# Patient Record
Sex: Female | Born: 1980
Health system: Southern US, Community
[De-identification: ages and names within clinical notes are randomized; demographics above are authoritative.]

## PROBLEM LIST (undated history)

## (undated) DIAGNOSIS — K921 Melena: Secondary | ICD-10-CM

## (undated) DIAGNOSIS — F419 Anxiety disorder, unspecified: Secondary | ICD-10-CM

## (undated) DIAGNOSIS — B019 Varicella without complication: Secondary | ICD-10-CM

## (undated) DIAGNOSIS — I493 Ventricular premature depolarization: Secondary | ICD-10-CM

## (undated) DIAGNOSIS — N390 Urinary tract infection, site not specified: Secondary | ICD-10-CM

## (undated) DIAGNOSIS — G43909 Migraine, unspecified, not intractable, without status migrainosus: Secondary | ICD-10-CM

## (undated) DIAGNOSIS — E162 Hypoglycemia, unspecified: Secondary | ICD-10-CM

## (undated) DIAGNOSIS — R011 Cardiac murmur, unspecified: Secondary | ICD-10-CM

## (undated) DIAGNOSIS — O24419 Gestational diabetes mellitus in pregnancy, unspecified control: Secondary | ICD-10-CM

## (undated) DIAGNOSIS — K602 Anal fissure, unspecified: Secondary | ICD-10-CM

## (undated) HISTORY — DX: Hypoglycemia, unspecified: E16.2

## (undated) HISTORY — DX: Ventricular premature depolarization: I49.3

## (undated) HISTORY — DX: Migraine, unspecified, not intractable, without status migrainosus: G43.909

## (undated) HISTORY — DX: Gestational diabetes mellitus in pregnancy, unspecified control: O24.419

## (undated) HISTORY — DX: Cardiac murmur, unspecified: R01.1

## (undated) HISTORY — DX: Anal fissure, unspecified: K60.2

## (undated) HISTORY — DX: Varicella without complication: B01.9

## (undated) HISTORY — DX: Urinary tract infection, site not specified: N39.0

## (undated) HISTORY — PX: WISDOM TOOTH EXTRACTION: SHX21

## (undated) HISTORY — DX: Melena: K92.1

---

## 1999-01-09 ENCOUNTER — Other Ambulatory Visit: Admission: RE | Admit: 1999-01-09 | Discharge: 1999-01-09 | Payer: Self-pay | Admitting: *Deleted

## 2000-02-28 ENCOUNTER — Other Ambulatory Visit: Admission: RE | Admit: 2000-02-28 | Discharge: 2000-02-28 | Payer: Self-pay | Admitting: *Deleted

## 2001-03-31 ENCOUNTER — Other Ambulatory Visit: Admission: RE | Admit: 2001-03-31 | Discharge: 2001-03-31 | Payer: Self-pay | Admitting: Obstetrics and Gynecology

## 2002-03-28 ENCOUNTER — Other Ambulatory Visit: Admission: RE | Admit: 2002-03-28 | Discharge: 2002-03-28 | Payer: Self-pay | Admitting: Obstetrics and Gynecology

## 2003-04-12 ENCOUNTER — Other Ambulatory Visit: Admission: RE | Admit: 2003-04-12 | Discharge: 2003-04-12 | Payer: Self-pay | Admitting: Obstetrics and Gynecology

## 2004-04-16 ENCOUNTER — Other Ambulatory Visit: Admission: RE | Admit: 2004-04-16 | Discharge: 2004-04-16 | Payer: Self-pay | Admitting: Obstetrics and Gynecology

## 2010-12-04 ENCOUNTER — Encounter: Payer: 59 | Attending: Obstetrics and Gynecology

## 2010-12-04 DIAGNOSIS — Z713 Dietary counseling and surveillance: Secondary | ICD-10-CM | POA: Insufficient documentation

## 2010-12-04 DIAGNOSIS — O99810 Abnormal glucose complicating pregnancy: Secondary | ICD-10-CM | POA: Insufficient documentation

## 2011-02-17 ENCOUNTER — Encounter (HOSPITAL_COMMUNITY)
Admission: RE | Admit: 2011-02-17 | Discharge: 2011-02-17 | Disposition: A | Discharge status: Home / Self Care | Payer: 59 | Source: Ambulatory Visit | Attending: Obstetrics and Gynecology | Admitting: Obstetrics and Gynecology

## 2011-02-17 LAB — CBC
HCT: 35 % — ABNORMAL LOW (ref 36.0–46.0)
Hemoglobin: 11.9 g/dL — ABNORMAL LOW (ref 12.0–15.0)
MCV: 91.4 fL (ref 78.0–100.0)
RDW: 13.1 % (ref 11.5–15.5)
WBC: 9.7 10*3/uL (ref 4.0–10.5)

## 2011-02-17 LAB — BASIC METABOLIC PANEL
BUN: 11 mg/dL (ref 6–23)
Chloride: 103 mEq/L (ref 96–112)
Creatinine, Ser: 0.49 mg/dL — ABNORMAL LOW (ref 0.50–1.10)
GFR calc Af Amer: >60 mL/min (ref >60)
Glucose, Bld: 73 mg/dL (ref 70–99)
Potassium: 4.3 mEq/L (ref 3.5–5.1)

## 2011-02-17 LAB — RPR: RPR Ser Ql: NONREACTIVE

## 2011-02-24 ENCOUNTER — Inpatient Hospital Stay (HOSPITAL_COMMUNITY)
Admission: RE | Admit: 2011-02-24 | Discharge: 2011-02-27 | DRG: 766 | Disposition: A | Discharge status: Home / Self Care | Payer: 59 | Source: Ambulatory Visit | Attending: Obstetrics and Gynecology | Admitting: Obstetrics and Gynecology

## 2011-02-24 DIAGNOSIS — O34219 Maternal care for unspecified type scar from previous cesarean delivery: Principal | ICD-10-CM | POA: Diagnosis present

## 2011-02-24 DIAGNOSIS — Z22330 Carrier of Group B streptococcus: Secondary | ICD-10-CM

## 2011-02-24 DIAGNOSIS — O99892 Other specified diseases and conditions complicating childbirth: Secondary | ICD-10-CM | POA: Diagnosis present

## 2011-02-24 DIAGNOSIS — O99814 Abnormal glucose complicating childbirth: Secondary | ICD-10-CM | POA: Diagnosis present

## 2011-02-24 LAB — GLUCOSE, CAPILLARY
Glucose-Capillary: 75 mg/dL (ref 70–99)
Glucose-Capillary: 75 mg/dL (ref 70–99)

## 2011-02-25 LAB — CBC
Hemoglobin: 10.5 g/dL — ABNORMAL LOW (ref 12.0–15.0)
Platelets: 143 10*3/uL — ABNORMAL LOW (ref 150–400)
RBC: 3.32 MIL/uL — ABNORMAL LOW (ref 3.87–5.11)
WBC: 10.8 10*3/uL — ABNORMAL HIGH (ref 4.0–10.5)

## 2011-02-26 LAB — RH IMMUNE GLOB WKUP(>/=20WKS)(NOT WOMEN'S HOSP)
Antibody Screen: NEGATIVE
Fetal Screen: NEGATIVE
Unit division: 0

## 2011-02-28 NOTE — Op Note (Signed)
  NAMELAMANDA, [name]                   ACCOUNT NO.:  [id number]  MEDICAL RECORD NO.:  [id number]  LOCATION:  9126                          FACILITY:  [facility]  PHYSICIAN:  Lenoard Aden, M.D.DATE OF BIRTH:  04/15/1981  DATE OF PROCEDURE: DATE OF DISCHARGE:                              OPERATIVE REPORT   PREOPERATIVE DIAGNOSES: 1. A 40-week intrauterine pregnancy with previous C-section for     repeat. 2. Gestational diabetes.  POSTOPERATIVE DIAGNOSES: 1. A 40-week intrauterine pregnancy with previous C-section for     repeat. 2. Gestational diabetes. 3. Nuchal cord x1.  PROCEDURE:  Repeat low segment transverse cesarean section.  SURGEON:  Lenoard Aden, MD  ASSISTANT:  Marlinda Mike, CNM  ANESTHESIA:  Spinal by Jean Rosenthal.  ESTIMATED BLOOD LOSS:  500 mL.  COMPLICATIONS:  None.  DRAINS:  Foley.  COUNTS:  Correct.  The patient to recovery in good condition.  BRIEF OPERATIVE NOTE:  After being apprised of the risks of anesthesia, infection, bleeding, injury to abdominal organs, the need for repair, delayed versus immediate complications including bowel and bladder injury, possible need for repair, the patient was brought to the operating room where she was administered spinal anesthetic without complications.  She was prepped and draped in the usual sterile fashion. Foley catheter was placed.  SCDs intact.  At this time, dilute Marcaine solution was placed.  A Pfannenstiel skin incision was made with scalpel and carried down to fascia which was nicked in the midline and transverse using Mayo scissors.  Rectus muscles were dissected sharply in the midline.  Peritoneum entered and bladder blade placed.  High- riding bladder flap was noted on the lower uterine segment.  This was dissected sharply using Metzenbaum scissors off the lower uterine segment.  Kerr hysterotomy incision was made.  Atraumatic delivery after amniotomy of clear fluid, full-term living female,  nuchal cord loosely x1, delivered and handed to pediatricians in attendance.  Apgars of 8 and 9.  Cord blood collected.  Placenta delivered manually intact from posterior location.  Uterus was curetted using a dry lap pack.  Normal tubes and ovaries were identified.  Uterine incision was closed in 2 running imbricating layers of 0 Monocryl suture with a single interrupted sutures placed along the left midline and in the midline for further hemostasis.  Bladder flap was inspected and found to be hemostatic.  Urine output was excellent and was clear.  The fascia was then reapproximated using 0 Monocryl in a continuous running fashion. Skin was closed using staples.  The patient tolerated the procedure well and was transferred to recovery in good condition.     Lenoard Aden, M.D.     RJT/MEDQ  D:  02/24/2011  T:  02/25/2011  Job:  [id number]  Electronically Signed by Olivia Mackie M.D. on 02/28/2011 07:36:05 AM

## 2011-02-28 NOTE — H&P (Signed)
  [name], [name]                   ACCOUNT NO.:  [id number]  MEDICAL RECORD NO.:  [id number]  LOCATION:  SDC                           [facility]ACILITY:  [facility]  PHYSICIAN:  Lenoard Aden, M.D.DATE OF BIRTH:  Feb 13, 1981  DATE OF ADMISSION:  02/17/2011 DATE OF DISCHARGE:  02/17/2011                             HISTORY & PHYSICAL   CHIEF COMPLAINT:  Repeat C-section.  HISTORY OF PRESENT ILLNESS:  She is a 30 year old white female, G2, P1 at [redacted] weeks gestation who presents for her elective repeat C-section. Her medications are prenatal vitamins.  She has no known drug allergies. She has a family history of ALS, breast cancer, and diabetes.  She is a nonsmoker, nondrinker.  She denies domestic or physical violence.  She had a primary C-section in 2009 with failed induction of labor.  Her prenatal course was complicated by gestational diabetes well controlled on diet.  PHYSICAL EXAMINATION:  GENERAL:  She is well-developed, well-nourished white female in no acute distress. VITAL SIGNS:  Weight of 147 pounds, height of 5 feet 2 inches. HEENT:  Normal. NECK:  Supple.  Full range of motion. LUNGS:  Clear. HEART:  Regular rhythm. ABDOMEN:  Soft, gravid, nontender.  Fetal weight 7 pounds.  Cervix is 1- 2 cm, __________ vertex -2. EXTREMITIES:  No cords. NEUROLOGIC:  Nonfocal. SKIN:  Intact.  IMPRESSION:  A 40-week intrauterine pregnancy at [redacted] weeks gestation for repeat C-section.  PLAN:  Proceed with repeat transverse cesarean section.  Risks of anesthesia, infection, bleeding, injury to abdominal organs, need for repair was discussed, delayed versus immediate complications to include bowel and bladder injury noted.  The patient acknowledges and wishes to proceed.     Lenoard Aden, M.D.     RJT/MEDQ  D:  02/24/2011  T:  02/24/2011  Job:  [id number]  Electronically Signed by Olivia Mackie M.D. on 02/28/2011 07:36:01 AM

## 2011-04-04 NOTE — Discharge Summary (Signed)
  [name], [name]                   ACCOUNT NO.:  [id number]  MEDICAL RECORD NO.:  [id number]  LOCATION:  9126                          [facility]  [facility]  PHYSICIAN:  Lenoard Aden, M.D.DATE OF BIRTH:  [birth year]  DATE OF ADMISSION:  02/24/2011 DATE OF DISCHARGE:  02/27/2011                              DISCHARGE SUMMARY   ADMITTING DIAGNOSIS:  Previous cesarean section for repeat schedule, intrauterine pregnancy at term.  DISCHARGE DIAGNOSIS:  Postoperative day #3 stable.  HISTORY:  The patient is a gravida 2, para 1-0-0-1 at 40 weeks with EDC of February 24, 2011.  Prenatal care was obtained at [facility] since 9 weeks and 3 days' gestation with Dr. Billy Coast as primary.  Prenatal labs included O negative, rubella immune, HIV negative, RPR negative, hepatitis B nonreactive and 1-hour GTT of January 28.  Group B strep positive.  PATIENT'S SURGICAL HISTORY:  The patient has a known previous primary cesarean section.  No other surgeries.  PAST MEDICAL HISTORY:  Gestational diabetes.  ALLERGIES:  The patient has no known allergies.  CURRENT MEDICATIONS:  Prenatal vitamins.  The patient presented for a scheduled cesarean section on February 24, 2011. Admission labs included CBC; WBC is 9.7, hemoglobin 11.9, hematocrit 35.0 and platelets of 160.  RPR was negative and MRSA by PCR was negative.  The patient underwent repeat cesarean section on February 24, 2011, by Dr. Billy Coast for previous cesarean section.  The patient was delivered of a female infant 6 pounds 12 ounces and Apgars of 9 and 9. Infant was transferred to regular nursery.  Please see operative record for further details.  POSTOPERATIVE COURSE:  Postoperative course was uneventful.  The patient's vital signs remained stable.  She was afebrile throughout hospital stay and physical exam was within normal limits.  Postoperative labs included CBC, WBC is 10.8, hemoglobin 10.5, hematocrit 30.4 and platelets of 143.  Antibody screen was negative  and fetal screen was negative.  Her capillary glucose was 75 fasting.  The patient received RhoGAM prophylaxis for Rh negative status with an Rh positive infant. The patient's wound was well approximated with staples which were removed prior to discharge and replaced with Steri-Strips.  No erythema, no ecchymosis at site and no drainage.  Newborn is being breastfed.  Discharge instructions given per [facility] booklet.  Diet is regular. Activity ad lib with postop restrictions x2 weeks.  Discharge medications include ibuprofen 800 mg p.o. q.8 h., Percocet 1-2 tablets p.o. q.4-6 h. p.r.n. and prenatal vitamins 1 tablet p.o. daily.  The patient is to follow up in 6 weeks for postpartum visit.    ______________________________ Arlan Organ, CNM   ______________________________ Lenoard Aden, M.D.    DP/MEDQ  D:  02/27/2011  T:  02/27/2011  Job:  [id number]  Electronically Signed by Arlan Organ CNM on 03/02/2011 01:30:05 AM Electronically Signed by Olivia Mackie M.D. on 04/04/2011 01:12:58 PM

## 2011-12-25 ENCOUNTER — Emergency Department (HOSPITAL_COMMUNITY)
Admission: EM | Admit: 2011-12-25 | Discharge: 2011-12-25 | Disposition: A | Discharge status: Home / Self Care | Payer: 59 | Attending: Emergency Medicine | Admitting: Emergency Medicine

## 2011-12-25 ENCOUNTER — Encounter (HOSPITAL_COMMUNITY): Payer: Self-pay | Admitting: Emergency Medicine

## 2011-12-25 DIAGNOSIS — F411 Generalized anxiety disorder: Secondary | ICD-10-CM | POA: Insufficient documentation

## 2011-12-25 DIAGNOSIS — F419 Anxiety disorder, unspecified: Secondary | ICD-10-CM | POA: Insufficient documentation

## 2011-12-25 DIAGNOSIS — Z79899 Other long term (current) drug therapy: Secondary | ICD-10-CM | POA: Insufficient documentation

## 2011-12-25 HISTORY — DX: Anxiety disorder, unspecified: F41.9

## 2011-12-25 LAB — RAPID URINE DRUG SCREEN, HOSP PERFORMED
Barbiturates: NOT DETECTED
Benzodiazepines: POSITIVE — AB
Cocaine: NOT DETECTED
Tetrahydrocannabinol: NOT DETECTED

## 2011-12-25 LAB — DIFFERENTIAL
Basophils Absolute: 0 10*3/uL (ref 0.0–0.1)
Eosinophils Absolute: 0.1 10*3/uL (ref 0.0–0.7)
Eosinophils Relative: 2 % (ref 0–5)
Lymphocytes Relative: 25 % (ref 12–46)
Monocytes Absolute: 0.5 10*3/uL (ref 0.1–1.0)

## 2011-12-25 LAB — CBC
HCT: 39.3 % (ref 36.0–46.0)
MCH: 29.2 pg (ref 26.0–34.0)
MCV: 82.6 fL (ref 78.0–100.0)
Platelets: 261 10*3/uL (ref 150–400)
RDW: 12.6 % (ref 11.5–15.5)
WBC: 5.8 10*3/uL (ref 4.0–10.5)

## 2011-12-25 LAB — POCT I-STAT, CHEM 8
Calcium, Ion: 1.19 mmol/L (ref 1.12–1.32)
Chloride: 107 mEq/L (ref 96–112)
HCT: 41 % (ref 36.0–46.0)
Sodium: 139 mEq/L (ref 135–145)
TCO2: 21 mmol/L (ref 0–100)

## 2011-12-25 MED ORDER — ACETAMINOPHEN 325 MG PO TABS: 650.0000 mg | ORAL_TABLET | ORAL | Status: DC | PRN

## 2011-12-25 MED ORDER — LORAZEPAM 1 MG PO TABS
1.0000 mg | ORAL_TABLET | Freq: Once | ORAL | Status: AC
  Administered 2011-12-25: 1 mg via ORAL
  Filled 2011-12-25: qty 1

## 2011-12-25 MED ORDER — ZOLPIDEM TARTRATE 5 MG PO TABS: 5.0000 mg | ORAL_TABLET | Freq: Every evening | ORAL | Status: DC | PRN

## 2011-12-25 MED ORDER — ALPRAZOLAM 0.5 MG PO TABS: 1.0000 mg | ORAL_TABLET | Freq: Four times a day (QID) | ORAL | Status: DC | PRN

## 2011-12-25 MED ORDER — ESCITALOPRAM OXALATE 10 MG PO TABS: 10.0000 mg | ORAL_TABLET | Freq: Every day | ORAL | Status: DC

## 2011-12-25 MED ORDER — ONDANSETRON HCL 8 MG PO TABS: 4.0000 mg | ORAL_TABLET | Freq: Three times a day (TID) | ORAL | Status: DC | PRN

## 2011-12-25 NOTE — Discharge Instructions (Signed)
FOLLOW UP WITH YOUR DOCTOR AS SCHEDULED. RETURN HERE WITH ANY WORSENING SYMPTOMS OR NEW CONCERNS. MAKE MEDICATION CHANGES AS PRESCRIBED.   Anxiety and Panic Attacks Your caregiver has informed you that you are having an anxiety or panic attack. There may be many forms of this. Most of the time these attacks come suddenly and without warning. They come at any time of day, including periods of sleep, and at any time of life. They may be strong and unexplained. Although panic attacks are very scary, they are physically harmless. Sometimes the cause of your anxiety is not known. Anxiety is a protective mechanism of the body in its fight or flight mechanism. Most of these perceived danger situations are actually nonphysical situations (such as anxiety over losing a job). CAUSES  The causes of an anxiety or panic attack are many. Panic attacks may occur in otherwise healthy people given a certain set of circumstances. There may be a genetic cause for panic attacks. Some medications may also have anxiety as a side effect. SYMPTOMS  Some of the most common feelings are:  Intense terror.   Dizziness, feeling faint.   Hot and cold flashes.   Fear of going crazy.   Feelings that nothing is real.   Sweating.   Shaking.   Chest pain or a fast heartbeat (palpitations).   Smothering, choking sensations.   Feelings of impending doom and that death is near.   Tingling of extremities, this may be from over-breathing.   Altered reality (derealization).   Being detached from yourself (depersonalization).  Several symptoms can be present to make up anxiety or panic attacks. DIAGNOSIS  The evaluation by your caregiver will depend on the type of symptoms you are experiencing. The diagnosis of anxiety or panic attack is made when no physical illness can be determined to be a cause of the symptoms. TREATMENT  Treatment to prevent anxiety and panic attacks may include:  Avoidance of circumstances that  cause anxiety.   Reassurance and relaxation.   Regular exercise.   Relaxation therapies, such as yoga.   Psychotherapy with a psychiatrist or therapist.   Avoidance of caffeine, alcohol and illegal drugs.   Prescribed medication.  SEEK IMMEDIATE MEDICAL CARE IF:   You experience panic attack symptoms that are different than your usual symptoms.   You have any worsening or concerning symptoms.  Document Released: 08/11/2005 Document Revised: 07/31/2011 Document Reviewed: 12/13/2009 [facility] Patient Information 2012 [place], [place].

## 2011-12-25 NOTE — ED Provider Notes (Signed)
History     CSN: [id number]  Arrival date & time 12/25/11  4098   First MD Initiated Contact with Patient 12/25/11 [phone]      Chief Complaint  Patient presents with  . Anxiety    (Consider location/radiation/quality/duration/timing/severity/associated sxs/prior treatment) Patient is a 31 y.o. female presenting with anxiety. The history is provided by the patient and the spouse.  Anxiety The current episode started more than 1 month ago. The problem occurs constantly. The problem has been rapidly worsening. Associated symptoms include chest pain and nausea. Pertinent negatives include no chills or fever. Associated symptoms comments: She has symptoms increasing in severity of palpitations, hands tingling, paralyzing fear and shortness of breath. Symptoms escalated after the birth of her 4-month old and have resulted in loss of appetite with no eating in 3 days, difficulty sleeping, inability to take care of her children or be alone with them. She feels she has become unable to function normally. She was seen by her doctor one week ago and started on Zoloft and Xanax but feels no better with symptoms worsening. No SI/HI, hallucinations. No substance abuse or use. Marland Kitchen    Past Medical History  Diagnosis Date  . Anxiety     History reviewed. No pertinent past surgical history.  History reviewed. No pertinent family history.  History  Substance Use Topics  . Smoking status: Never Smoker   . Smokeless tobacco: Not on file  . Alcohol Use: Yes     occasional    OB History    Grav Para Term Preterm Abortions TAB SAB Ect Mult Living                  Review of Systems  Constitutional: Positive for appetite change. Negative for fever and chills.  Respiratory: Positive for shortness of breath.   Cardiovascular: Positive for chest pain and palpitations.  Gastrointestinal: Positive for nausea.  Neurological: Negative.   Psychiatric/Behavioral:       See HPI.    Allergies  Review of  patient's allergies indicates no known allergies.  Home Medications   Current Outpatient Rx  Name Route Sig Dispense Refill  . ACETAMINOPHEN 500 MG PO TABS Oral Take 1,000 mg by mouth every 6 (six) hours as needed. For pain    . ALPRAZOLAM 0.5 MG PO TABS Oral Take 0.5 mg by mouth every 6 (six) hours as needed. For anxiety    . BUSPIRONE HCL 5 MG PO TABS Oral Take 5 mg by mouth 3 (three) times daily.    Marland Kitchen ESZOPICLONE 2 MG PO TABS Oral Take 2 mg by mouth at bedtime. Take immediately before bedtime    . ONDANSETRON 8 MG PO TBDP Oral Take 8 mg by mouth every 8 (eight) hours as needed. For nausea    . SERTRALINE HCL 50 MG PO TABS Oral Take 50 mg by mouth daily.      BP 114/70  Pulse 76  Temp(Src) 98.2 F (36.8 C) (Oral)  Resp 18  SpO2 100%  Physical Exam  Constitutional: She appears well-developed and well-nourished.  HENT:  Head: Normocephalic.  Neck: Normal range of motion. Neck supple.  Cardiovascular: Normal rate and regular rhythm.   Pulmonary/Chest: Effort normal and breath sounds normal.  Abdominal: Soft. Bowel sounds are normal. There is no tenderness. There is no rebound and no guarding.  Musculoskeletal: Normal range of motion.  Neurological: She is alert. No cranial nerve deficit.  Skin: Skin is warm and dry. No rash noted.  Psychiatric: She  has a normal mood and affect.       She is calm, cooperative.    ED Course  Procedures (including critical care time)  Labs Reviewed  URINE RAPID DRUG SCREEN (HOSP PERFORMED) - Abnormal; Notable for the following:    Benzodiazepines POSITIVE (*) REPEATED TO VERIFY   All other components within normal limits  POCT I-STAT, CHEM 8 - Abnormal; Notable for the following:    Glucose, Bld 101 (*)    All other components within normal limits  CBC  DIFFERENTIAL  PREGNANCY, URINE  ETHANOL   Results for orders placed during the hospital encounter of 12/25/11  CBC      Component Value Range   WBC 5.8  4.0 - 10.5 (K/uL)   RBC 4.76   3.87 - 5.11 (MIL/uL)   Hemoglobin 13.9  12.0 - 15.0 (g/dL)   HCT 16.1  09.6 - 04.5 (%)   MCV 82.6  78.0 - 100.0 (fL)   MCH 29.2  26.0 - 34.0 (pg)   MCHC 35.4  30.0 - 36.0 (g/dL)   RDW 40.9  81.1 - 91.4 (%)   Platelets 261  150 - 400 (K/uL)  DIFFERENTIAL      Component Value Range   Neutrophils Relative 63  43 - 77 (%)   Neutro Abs 3.7  1.7 - 7.7 (K/uL)   Lymphocytes Relative 25  12 - 46 (%)   Lymphs Abs 1.4  0.7 - 4.0 (K/uL)   Monocytes Relative 9  3 - 12 (%)   Monocytes Absolute 0.5  0.1 - 1.0 (K/uL)   Eosinophils Relative 2  0 - 5 (%)   Eosinophils Absolute 0.1  0.0 - 0.7 (K/uL)   Basophils Relative 1  0 - 1 (%)   Basophils Absolute 0.0  0.0 - 0.1 (K/uL)  PREGNANCY, URINE      Component Value Range   Preg Test, Ur NEGATIVE  NEGATIVE   ETHANOL      Component Value Range   Alcohol, Ethyl (B) <11  0 - 11 (mg/dL)  URINE RAPID DRUG SCREEN (HOSP PERFORMED)      Component Value Range   Opiates NONE DETECTED  NONE DETECTED    Cocaine NONE DETECTED  NONE DETECTED    Benzodiazepines POSITIVE (*) NONE DETECTED    Amphetamines NONE DETECTED  NONE DETECTED    Tetrahydrocannabinol NONE DETECTED  NONE DETECTED    Barbiturates NONE DETECTED  NONE DETECTED   POCT I-STAT, CHEM 8      Component Value Range   Sodium 139  135 - 145 (mEq/L)   Potassium 4.0  3.5 - 5.1 (mEq/L)   Chloride 107  96 - 112 (mEq/L)   BUN 7  6 - 23 (mg/dL)   Creatinine, Ser 7.82  0.50 - 1.10 (mg/dL)   Glucose, Bld 956 (*) 70 - 99 (mg/dL)   Calcium, Ion 2.13  0.86 - 1.32 (mmol/L)   TCO2 21  0 - 100 (mmol/L)   Hemoglobin 13.9  12.0 - 15.0 (g/dL)   HCT 57.8  46.9 - 62.9 (%)    No results found.   No diagnosis found. 1. Severe anxiety/panic   MDM  Discussed with the psychiatrist on-call who performed telepsych interview with patient. She recommends changes in medications from Zoloft to Lexapro and an increase in Xanax. The patient does not want inpatient help and she is safe to go home with significant family  support around her and help with her children. Husband at bedside.  Rodena Medin, PA-C 12/25/11 1453

## 2011-12-25 NOTE — ED Notes (Signed)
Pt sts hx of anxiety x years that has been worse since having a child last August and then has been much worse x 1.5 weeks; pt sts was seen by PCP and started on xanax and zoloft but not helping and sts she seems to be worse; pt sts thinks that anxiety could be triggered by food because it makes her nauseated; pt sts not eating x 3-4 days and has not slept in several days

## 2011-12-25 NOTE — BH Assessment (Signed)
Assessment Note   [name] is an 31 y.o. female. Pt presented to [facility] with her husband after being referred by her PCP for severe anxiety including panic attacks that have been rapidly worsening.  Pt reports symptoms increasing in severity of palpitations, hands tingling, paralyzing fear and shortness of breath. Pt stated that the symptoms escalated after the birth of her 2-month old and have resulted in loss of appetite with no eating in 3 days, little sleep, inability to take care of her children or be alone with them. Pt feels she has become unable to function normally and stated that the panic worsens when her husband leaves for work in the mornings. Pt stated was seen by her PCP one week ago and started on Zoloft and Xanax but feels no better with symptoms worsening. Pt stated she also went to an appt with a psychologist last week for her intake appt and had another scheduled appt today that she had to cancel to come to the ED.  Pt has no other mental health history.  Pt denies SI/HI, hallucinations or delusions. Pt denies SA.  Spouse present during assessment per pt request.  Consulted with PA-C Langley Adie and EDP Ignacia Palma who agreed a telepsych was warranted for further assessment and treatment recommendations.  Completed telepsych consult.  Completed assessment, assessment notification and faxed to [facility] to log.  Pt pending telepsych.   Axis I: Panic Disorder Without Agoraphobia 300.01 Axis II: Deferred Axis III:  Past Medical History  Diagnosis Date  . Anxiety    Axis IV: other psychosocial or environmental problems Axis V: 41-50 serious symptoms  Past Medical History:  Past Medical History  Diagnosis Date  . Anxiety     History reviewed. No pertinent past surgical history.  Family History: History reviewed. No pertinent family history.  Social History:  reports that she has never smoked. She does not have any smokeless tobacco history on file. She reports that she drinks  alcohol. She reports that she does not use illicit drugs.  Additional Social History:  Alcohol / Drug Use Pain Medications: none Prescriptions: see list Over the Counter: see list History of alcohol / drug use?: No history of alcohol / drug abuse Longest period of sobriety (when/how long): na Negative Consequences of Use:  (na) Withdrawal Symptoms:  (na) Allergies: No Known Allergies  Home Medications:  (Not in a hospital admission)  OB/GYN Status:  No LMP recorded.  General Assessment Data Location of Assessment: [facility] ED Living Arrangements: Spouse/significant other;Children Can pt return to current living arrangement?: Yes Admission Status: Voluntary Is patient capable of signing voluntary admission?: Yes Transfer from: Acute Hospital Referral Source: MD  Education Status Is patient currently in school?: No  Risk to self Suicidal Ideation: No Suicidal Intent: No Is patient at risk for suicide?: No Suicidal Plan?: No Access to Means: No What has been your use of drugs/alcohol within the last 12 months?: na - pt denies use Previous Attempts/Gestures: No How many times?: 0  Other Self Harm Risks: na - pt denies Triggers for Past Attempts: Other (Comment) (na) Intentional Self Injurious Behavior: None (pt denies) Family Suicide History: No Recent stressful life event(s): Turmoil (Comment) (Panic attacks) Persecutory voices/beliefs?: No Depression: No Depression Symptoms:  (pt denies) Substance abuse history and/or treatment for substance abuse?: No Suicide prevention information given to non-admitted patients: Not applicable  Risk to Others Homicidal Ideation: No Thoughts of Harm to Others: No Current Homicidal Intent: No Current Homicidal Plan: No Access to  Homicidal Means: No Identified Victim: na History of harm to others?: No Assessment of Violence: None Noted Violent Behavior Description: na - pt calm, cooperative Does patient have access to weapons?:  No Criminal Charges Pending?: No Does patient have a court date: No  Psychosis Hallucinations: None noted Delusions: None noted  Mental Status Report Appear/Hygiene: Other (Comment) (casual) Eye Contact: Good Motor Activity: Restlessness Speech: Logical/coherent Level of Consciousness: Alert;Restless Mood: Anxious Affect: Anxious Anxiety Level: Panic Attacks Panic attack frequency: 3x/day Most recent panic attack: today Thought Processes: Coherent;Relevant Judgement: Unimpaired Orientation: Person;Place;Time;Situation Obsessive Compulsive Thoughts/Behaviors: None  Cognitive Functioning Concentration: Decreased Memory: Recent Intact;Remote Intact IQ: Above Average Insight: Fair Impulse Control: Good Appetite: Poor Weight Loss: 40  (in last year (some post pregnancy weight)) Weight Gain: 0  Sleep: Decreased Total Hours of Sleep:  (pt reports not sleeping but for an hour over last 3 days) Vegetative Symptoms: Staying in bed  Prior Inpatient Therapy Prior Inpatient Therapy: No Prior Therapy Dates: na Prior Therapy Facilty/Provider(s): na Reason for Treatment: na  Prior Outpatient Therapy Prior Outpatient Therapy: Yes Prior Therapy Dates: April 2013 Prior Therapy Facilty/Provider(s): [facility] (saw once for intake appt) Reason for Treatment: Anxiety/panic attacks  ADL Screening (condition at time of admission) Patient's cognitive ability adequate to safely complete daily activities?: Yes Patient able to express need for assistance with ADLs?: Yes Independently performs ADLs?: Yes Communication: Independent Dressing (OT): Independent Grooming: Independent Feeding: Independent Bathing: Independent Toileting: Independent In/Out Bed: Independent Walks in Home: Independent Weakness of Legs: None Weakness of Arms/Hands: None  Home Assistive Devices/Equipment Home Assistive Devices/Equipment: None    Abuse/Neglect Assessment (Assessment to be complete while  patient is alone) Physical Abuse: Denies Verbal Abuse: Denies Sexual Abuse: Denies Exploitation of patient/patient's resources: Denies Self-Neglect: Denies Values / Beliefs Cultural Requests During Hospitalization: None Spiritual Requests During Hospitalization: None Consults Spiritual Care Consult Needed: No Social Work Consult Needed: No Merchant navy officer (For Healthcare) Advance Directive: Patient does not have advance directive;Patient would not like information Pre-existing out of facility DNR order (yellow form or pink MOST form): Other (comment) (unknown)    Additional Information 1:1 In Past 12 Months?: No CIRT Risk: No Elopement Risk: No Does patient have medical clearance?: Yes     Disposition:  Disposition Disposition of Patient: Other dispositions (pending telepsych) Other disposition(s): Other (Comment) (pending telepsych)  On Site Evaluation by:   Reviewed with Physician:  PA-C Shari A Narvaez/EDP Bryson Ha, Rennis Harding 12/25/2011 3:01 PM

## 2011-12-25 NOTE — ED Provider Notes (Signed)
Medical screening examination/treatment/procedure(s) were performed by non-physician practitioner and as supervising physician I was immediately available for consultation/collaboration.   Carleene Cooper III, MD 12/25/11 2035

## 2011-12-25 NOTE — ED Notes (Signed)
telepsych not completed yet. Called the service back for conformation, they stated that they would call the MD back. Number left with the service.

## 2011-12-25 NOTE — BH Assessment (Signed)
Assessment Note  Update:  Pt received telepsych and telepsychiatrist recommended pt be discharged and given outpatient referrals.  Pt has a current provider, but requested in-network referrals for psychiatry and therapy, so these were given to pt and pt to follow up.  Pt's medications also adjusted.  Updated assessment disposition, as PA and EDP in agreement with disposition, completed assessment notification and faxed to [facility] to log.  Updated ED staff.     Disposition:  Disposition Disposition of Patient: Referred to;Outpatient treatment Type of outpatient treatment: Adult Other disposition(s): Other (Comment) (pending telepsych) Patient referred to: Outpatient clinic referral (Pt discharged home with outpatient referrals)  On Site Evaluation by:   Reviewed with Physician:  PA-C Shari A Narvaez/EDP Bryson Ha, Rennis Harding 12/25/2011 3:31 PM

## 2011-12-25 NOTE — ED Notes (Signed)
Telpsych consult at the bedside.

## 2012-01-07 ENCOUNTER — Ambulatory Visit (HOSPITAL_COMMUNITY): Payer: Commercial Managed Care - PPO | Admitting: Psychiatry

## 2013-06-30 ENCOUNTER — Institutional Professional Consult (permissible substitution): Payer: Commercial Managed Care - PPO | Admitting: Cardiology

## 2013-07-28 ENCOUNTER — Institutional Professional Consult (permissible substitution): Payer: Commercial Managed Care - PPO | Admitting: Cardiology

## 2013-08-04 ENCOUNTER — Encounter (INDEPENDENT_AMBULATORY_CARE_PROVIDER_SITE_OTHER): Payer: Self-pay

## 2013-08-04 ENCOUNTER — Encounter: Payer: Self-pay | Admitting: Interventional Cardiology

## 2013-08-04 ENCOUNTER — Ambulatory Visit (INDEPENDENT_AMBULATORY_CARE_PROVIDER_SITE_OTHER): Payer: Commercial Managed Care - PPO | Admitting: Interventional Cardiology

## 2013-08-04 VITALS — BP 119/68 | HR 86 | Ht 62.0 in | Wt 126.0 lb

## 2013-08-04 DIAGNOSIS — R002 Palpitations: Secondary | ICD-10-CM

## 2013-08-04 DIAGNOSIS — F411 Generalized anxiety disorder: Secondary | ICD-10-CM

## 2013-08-04 DIAGNOSIS — R011 Cardiac murmur, unspecified: Secondary | ICD-10-CM

## 2013-08-04 DIAGNOSIS — F419 Anxiety disorder, unspecified: Secondary | ICD-10-CM

## 2013-08-04 NOTE — Patient Instructions (Signed)
Your physician recommends that you continue on your current medications as directed. Please refer to the Current Medication list given to you today.  Your physician has requested that you have an echocardiogram. Echocardiography is a painless test that uses sound waves to create images of your heart. It provides your doctor with information about the size and shape of your heart and how well your heart's chambers and valves are working. This procedure takes approximately one hour. There are no restrictions for this procedure.  Follow up pending lab results

## 2013-08-04 NOTE — Progress Notes (Signed)
Patient ID: [name], female   DOB: 03-06-1981, 32 y.o.   MRN: [id number]   Date: 08/04/2013 ID: [name], DOB Apr 12, 1981, MRN [id number] PCP: Hollice Espy, MD  Reason: Palpitations  ASSESSMENT;  1. Systolic murmur, probably flow/physiologic 2. Palpitations, symptomatic, infrequent, and sporadic 3. Medical record indicates a past history of anxiety  PLAN:  1. 2-D Doppler echocardiogram to document structural integrity of the heart 2. When symptoms become more frequent we will consider doing a monitor if she continues to have difficulty. Right now she would have to wear a 30 day monitor or even longer because of the infrequent nature of the complaint.   SUBJECTIVE: [name] is a 32 y.o. female who has a two-year history of palpitations that occurred initially during her second pregnancy. She describes these as a flutter in her chest. Once it starts he can go on for 10-15 minutes. There is no racing heart, chest pain, dyspnea, dizziness, or other complaint. It happens most frequently when she is at rest. She has not trouble during physical activity. She has no history of syncope or cardiac disease. There is a history of heart murmur.   No Known Allergies  Current Outpatient Prescriptions on File Prior to Visit  Medication Sig Dispense Refill  . acetaminophen (TYLENOL) 500 MG tablet Take 1,000 mg by mouth every 6 (six) hours as needed. For pain      . [DISCONTINUED] sertraline (ZOLOFT) 50 MG tablet Take 50 mg by mouth daily.       No current facility-administered medications on file prior to visit.    Past Medical History  Diagnosis Date  . Anxiety     No past surgical history on file.  History   Social History  . Marital Status: Married    Spouse Name: N/A    Number of Children: N/A  . Years of Education: N/A   Occupational History  . Not on file.   Social History Main Topics  . Smoking status: Never Smoker   . Smokeless tobacco: Not on file  . Alcohol  Use: Yes     Comment: occasional  . Drug Use: No  . Sexual Activity:    Other Topics Concern  . Not on file   Social History Narrative  . No narrative on file    No family history on file.  ROS: No history of syncope. Denies sustained tachycardia. No family history of sudden death or arrhythmia. She does use caffeine but not excessively. She has an occasional alcoholic beverage. No over-the-counter nasal sprays or sympathomimetic use. She sleeps well. She denies history of stroke, hypertension, diabetes, gastrointestinal disease, and asthma.. Other systems negative for complaints.  OBJECTIVE: BP 119/68  Pulse 86  Ht 5\' 2"  (1.575 m)  Wt 126 lb (57.153 kg)  BMI 23.04 kg/m2,  General: No acute distress, young and healthy appearing HEENT: normal  Neck: JVD flat. Carotids 2+ and symmetric Chest: 2 clear Cardiac: Murmur: 2-3 of 6 systolic murmur left midsternal border and left upper sternal border and less so at the right upper sternal border. The murmur becomes progressively less intense with sitting and nearly disappears with standing.. Gallop: Absent. Rhythm: Regular. Other: Normal Abdomen: Bruit: Absent. Pulsation: Absent Extremities: Edema: Absent. Pulses: 2+ Neuro: Normal Psych: Normal  ECG: Normal with the exception of right axis with QRS at 90

## 2013-08-15 ENCOUNTER — Encounter: Payer: Self-pay | Admitting: Cardiology

## 2013-08-15 ENCOUNTER — Ambulatory Visit (HOSPITAL_COMMUNITY): Payer: 59 | Attending: Interventional Cardiology | Admitting: Radiology

## 2013-08-15 DIAGNOSIS — R002 Palpitations: Secondary | ICD-10-CM | POA: Insufficient documentation

## 2013-08-15 DIAGNOSIS — R011 Cardiac murmur, unspecified: Secondary | ICD-10-CM

## 2013-08-15 NOTE — Progress Notes (Signed)
Echocardiogram performed.  

## 2013-08-17 ENCOUNTER — Telehealth: Payer: Self-pay

## 2013-08-17 NOTE — Telephone Encounter (Signed)
pt given echo results.The echocardiogram is normal.pt verbalized understanding.

## 2013-08-17 NOTE — Telephone Encounter (Signed)
Message copied by Jarvis Newcomer on Wed Aug 17, 2013 10:00 AM ------      Message from: Verdis Prime      Created: Mon Aug 15, 2013  6:08 PM       The echocardiogram is normal ------

## 2013-08-22 ENCOUNTER — Other Ambulatory Visit (HOSPITAL_COMMUNITY): Payer: 59

## 2014-01-19 ENCOUNTER — Ambulatory Visit (INDEPENDENT_AMBULATORY_CARE_PROVIDER_SITE_OTHER): Payer: 59 | Admitting: Family Medicine

## 2014-01-19 ENCOUNTER — Telehealth: Payer: Self-pay | Admitting: Family Medicine

## 2014-01-19 ENCOUNTER — Encounter: Payer: Self-pay | Admitting: Family Medicine

## 2014-01-19 VITALS — BP 120/82 | HR 76 | Temp 98.6°F | Ht 62.0 in | Wt 121.5 lb

## 2014-01-19 DIAGNOSIS — Z7689 Persons encountering health services in other specified circumstances: Secondary | ICD-10-CM

## 2014-01-19 DIAGNOSIS — F411 Generalized anxiety disorder: Secondary | ICD-10-CM

## 2014-01-19 DIAGNOSIS — R300 Dysuria: Secondary | ICD-10-CM

## 2014-01-19 DIAGNOSIS — Z7189 Other specified counseling: Secondary | ICD-10-CM

## 2014-01-19 DIAGNOSIS — F419 Anxiety disorder, unspecified: Secondary | ICD-10-CM

## 2014-01-19 LAB — POCT URINALYSIS DIPSTICK
Bilirubin, UA: NEGATIVE
GLUCOSE UA: NEGATIVE
Ketones, UA: NEGATIVE
Leukocytes, UA: NEGATIVE
NITRITE UA: NEGATIVE
PH UA: 6
Protein, UA: NEGATIVE
RBC UA: NEGATIVE
Spec Grav, UA: <=1.005
UROBILINOGEN UA: 0.2

## 2014-01-19 MED ORDER — OLANZAPINE 2.5 MG PO TABS
ORAL_TABLET | ORAL | Status: DC
Start: 2014-01-19 — End: 2014-06-26

## 2014-01-19 NOTE — Progress Notes (Signed)
Pre visit review using our clinic review tool, if applicable. No additional management support is needed unless otherwise documented below in the visit note. 

## 2014-01-19 NOTE — Telephone Encounter (Signed)
I called [facility] and spoke with the pharmacist Levada Dy and advised her per Dr Maudie Mercury the pt is weaning off of generic Zyprexa and she wants the pt to have #45 with one refill since this is a taper.

## 2014-01-19 NOTE — Telephone Encounter (Signed)
Pharm would like to confirm the dosage. This is a dosage change from what she has been on. Was this intentional? Number for pharm is a direct line to pharmacist angela.

## 2014-01-19 NOTE — Progress Notes (Signed)
No chief complaint on file.   HPI:  [name] is here to establish care.  Used to see Dr. Justin Mend at [facility]. Last PCP and physical: Sees Dr. Edwyna Ready yearly for physical exam.  Has the following chronic problems and concerns today:  Patient Active Problem List   Diagnosis Date Noted  . Systolic murmur - had eval with cardiology in 2014 and all ok per pt report 08/04/2013  . Palpitations 08/04/2013  . Anxiety - wnats to be off medication, weaning off zyprexa 12/2013    Anxiety: -GAD, worries about getting sick -had some nausea with this, hx of occ panic attacks -never hospitalized, was seeing a psychiatrist in the past -things are a lot better now -currently on zyprexa - weaning off of this and on 2.5mg  every other day -she did use xanax, pristiq and a few other ones in the past -not getting counseling, no regular exercise  Dysuria: -UTI 1-2 times yearly after sex -dysuria for 2 days -denies: fevers, flank pain, nausea, hematuira, vomiting pelvic pain, vaginal symptoms -FDLMP Jan 03, 2014   ROS: See pertinent positives and negatives per HPI.  Past Medical History  Diagnosis Date  . Anxiety   . Blood in stool   . Chicken pox   . Urinary tract infection   . Gestational diabetes     gestational diabetes    Family History  Problem Relation Age of Onset  . Diabetes Paternal Grandfather     History   Social History  . Marital Status: Married    Spouse Name: N/A    Number of Children: N/A  . Years of Education: N/A   Social History Main Topics  . Smoking status: Never Smoker   . Smokeless tobacco: None  . Alcohol Use: Yes     Comment: occasional  . Drug Use: No  . Sexual Activity: None   Other Topics Concern  . None   Social History Narrative   Work or School: [employer] Situation: Lives with two children and husband      Spiritual Beliefs: Catholic      Lifestyle: no regular exercise; diet is goo             Current outpatient  prescriptions:OLANZapine (ZYPREXA) 2.5 MG tablet, Take 1 every 2 days, Disp: 60 tablet, Rfl: 0;  [DISCONTINUED] sertraline (ZOLOFT) 50 MG tablet, Take 50 mg by mouth daily., Disp: , Rfl:   EXAM:  Filed Vitals:   01/19/14 0821  BP: 120/82  Pulse: 76  Temp: 98.6 F (37 C)    Body mass index is 22.22 kg/(m^2).  GENERAL: vitals reviewed and listed above, alert, oriented, appears well hydrated and in no acute distress  HEENT: atraumatic, conjunttiva clear, no obvious abnormalities on inspection of external nose and ears  NECK: no obvious masses on inspection  LUNGS: clear to auscultation bilaterally, no wheezes, rales or rhonchi, good air movement  CV: HRRR, no peripheral edema  MS: moves all extremities without noticeable abnormality  PSYCH: pleasant and cooperative, no obvious depression or anxiety  ASSESSMENT AND PLAN:  Discussed the following assessment and plan:  Anxiety - Plan: OLANZapine (ZYPREXA) 2.5 MG tablet  Dysuria - Plan: POC Urinalysis Dipstick, CULTURE, URINE COMPREHENSIVE  Encounter to establish care  -We reviewed the PMH, PSH, FH, SH, Meds and Allergies. -We provided refills for any medications we will prescribe as needed. -We addressed current concerns per orders and patient instructions. -We have asked for records for pertinent exams,  studies, vaccines and notes from previous providers. -We have advised patient to follow up per instructions below. -udip clean, discuss UTI prevention strategies -discuss options for anxiety - she wants to get off medicaiton  -Patient advised to return or notify a doctor immediately if symptoms worsen or persist or new concerns arise.  Patient Instructions    -PLEASE SIGN UP FOR MYCHART TODAY   We recommend the following healthy lifestyle measures: - eat a healthy diet consisting of lots of vegetables, fruits, beans, nuts, seeds, healthy meats such as white chicken and fish and whole grains.  - avoid fried foods, fast  food, processed foods, sodas, red meet and other fattening foods.  - get a least 150 minutes of aerobic exercise per week.   -exercise on a regular basis  -counseling  -continue taper of medication  -checking urine and will call you with results  Follow up in: 3-4 months        Lucretia Kern

## 2014-01-19 NOTE — Patient Instructions (Addendum)
 -  PLEASE SIGN UP FOR MYCHART TODAY   We recommend the following healthy lifestyle measures: - eat a healthy diet consisting of lots of vegetables, fruits, beans, nuts, seeds, healthy meats such as white chicken and fish and whole grains.  - avoid fried foods, fast food, processed foods, sodas, red meet and other fattening foods.  - get a least 150 minutes of aerobic exercise per week.   -exercise on a regular basis  -counseling  -continue taper of medication  -checking urine and will call you with results  Follow up in: 3-4 months

## 2014-01-21 LAB — CULTURE, URINE COMPREHENSIVE: Colony Count: >=100000

## 2014-01-23 MED ORDER — CIPROFLOXACIN HCL 500 MG PO TABS: 500.0000 mg | ORAL_TABLET | Freq: Two times a day (BID) | ORAL | Status: DC

## 2014-01-23 NOTE — Addendum Note (Signed)
Addended by: Agnes Lawrence on: 01/23/2014 11:36 AM   Modules accepted: Orders

## 2014-01-26 ENCOUNTER — Telehealth: Payer: Self-pay | Admitting: Family Medicine

## 2014-01-26 NOTE — Telephone Encounter (Signed)
Patient Information:  Caller Name: Kaena  Phone: [phone]  Patient: [name], [name]  Gender: Female  DOB: 22-Sep-1980  Age: 33 Years  PCP: Maudie Mercury (TEXT 1st, after 20 mins can call), [facility])  Pregnant: No  Office Follow Up:  Does the office need to follow up with this patient?: Yes  Instructions For The Office: Calling about right flank pain that comes in waves but is getting better--no pain today but she had some last night. Was seen in the office on 01/18/14  for freq,urgency,body aches,weakness and nausea--dx with UTI and started on Cipro on 01/23/14. Most sxs have gotten better. She is concerned because she only has the antibiotic until Saturday and wants to know if she can have more. Please advise  RN Note:  Advised pt that a message would be sent to the office for follow-up. Home care adive given.  Symptoms  Reason For Call & Symptoms: Calling about right flank pain that comes in waves but is getting better--no pain today but she had some last night. Was seen in the office on 01/18/14  for freq,urgency,body aches,weakness and nausea--dx with UTI and started on Cipro on 01/23/14. Most sxs have gotten better. She is concerned because she only has the antibiotic until Saturday and wants to know if she can have more.  Reviewed Health History In EMR: Yes  Reviewed Medications In EMR: Yes  Reviewed Allergies In EMR: Yes  Reviewed Surgeries / Procedures: Yes  Date of Onset of Symptoms: 01/23/2014  Treatments Tried: Tylenol  Treatments Tried Worked: Yes OB / GYN:  LMP: 01/03/2014  Guideline(s) Used:  Urination Pain - Female  Disposition Per Guideline:   See Today in Office  Reason For Disposition Reached:   Taking antibiotic > 3 days for UTI and painful urination not improved  Advice Given:  Fluids:   Drink extra fluids. Drink 8-10 glasses of liquids a day (Reason: to produce a dilute, non-irritating urine).  Call Back If:  You become worse.  Patient Will Follow Care  Advice:  YES

## 2014-01-26 NOTE — Telephone Encounter (Signed)
Left message for patient to call back. Cyndi Ketch RN/CAN

## 2014-01-26 NOTE — Telephone Encounter (Signed)
I called the pts home number and left a message for her to return my call and I called the pts cell and left a detailed message with the information below.

## 2014-01-26 NOTE — Telephone Encounter (Signed)
Even for kidney infection that should have been plenty of antibiotic to take care of the infection and the urine was sensitive to that antibiotic. If symptoms persist advise appointment tomorrow and will recheck urine.

## 2014-02-07 ENCOUNTER — Ambulatory Visit (INDEPENDENT_AMBULATORY_CARE_PROVIDER_SITE_OTHER): Payer: 59 | Admitting: Licensed Clinical Social Worker

## 2014-02-07 DIAGNOSIS — F4322 Adjustment disorder with anxiety: Secondary | ICD-10-CM

## 2014-02-09 ENCOUNTER — Ambulatory Visit: Payer: 59 | Admitting: Licensed Clinical Social Worker

## 2014-02-27 ENCOUNTER — Ambulatory Visit: Payer: 59 | Admitting: Licensed Clinical Social Worker

## 2014-02-28 ENCOUNTER — Ambulatory Visit (INDEPENDENT_AMBULATORY_CARE_PROVIDER_SITE_OTHER): Payer: 59 | Admitting: Licensed Clinical Social Worker

## 2014-02-28 DIAGNOSIS — F411 Generalized anxiety disorder: Secondary | ICD-10-CM

## 2014-04-24 ENCOUNTER — Encounter: Payer: 59 | Admitting: Family Medicine

## 2014-04-26 ENCOUNTER — Encounter: Payer: 59 | Admitting: Family Medicine

## 2014-04-28 ENCOUNTER — Telehealth: Payer: Self-pay | Admitting: Family Medicine

## 2014-04-28 NOTE — Telephone Encounter (Signed)
Patient Information:  Caller Name: Hatsuko  Phone: [phone]  Patient: [name], Worst  Gender: Female  DOB: 30-Jun-1981  Age: 33 Years  PCP: Maudie Mercury (TEXT 1st, after 20 mins can call), [facility])  Pregnant: No  Office Follow Up:  Does the office need to follow up with this patient?: No  Instructions For The Office: N/A   Symptoms  Reason For Call & Symptoms: Woke this morning with R eye red and puffy with yellowish discharge from her eye. Daugher has pink eye and started on drops on 04/25/14. Afebrile. No other sx. Requesting Antibiotic Eye Drops. Polytrim Eye Drops 2 drops to both eyes QID x 5 days, no refills called into [place] per standing order. Spoke to [place], Frank.  Reviewed Health History In EMR: Yes  Reviewed Medications In EMR: Yes  Reviewed Allergies In EMR: Yes  Reviewed Surgeries / Procedures: Yes  Date of Onset of Symptoms: 04/28/2014 OB / GYN:  LMP: 04/18/2014  Guideline(s) Used:  Eye - Pus or Discharge  Disposition Per Guideline:   Home Care  Reason For Disposition Reached:   Eye with yellow/green discharge or eyelashes stick together, and PCP standing order to call in antibiotic eye drops  Advice Given:  Reassurance:  A small amount of pus or mucus in the inner corner of the eye is often due to a cold or virus. Sometimes it's just a reaction to an irritant in the eye.  You can get pink eye from someone who has had it recently.  Pink eye will not harm your eyesight.  Viral conjunctivitis (pink eye) does not need antibiotic treatment. It gets better by itself. Usually it's gone in 2 or 3 days.  Here is some care advice that should help.  Contact Lenses:  Switch to glasses for a short time. This will help stop damage to your eye.  Clean your contacts before wearing them again.  Eyelid Cleansing:   Gently wash eyelids and lashes with warm water and wet cotton balls (or cotton gauze). Remove all the dried and liquid pus.  Do this as  often as needed.  Contagiousness:  [place]e is contagious. It is very easy to spread to other people. You can spread it by shaking hands.  Try not to touch your eyes. Wash your hands often. Do not share towels. Try not to touch your eyes. Wash your hands frequently. Do not share towels.  You may return to work or school. Avoid physical contact (e.g., shaking hands) until the symptoms have resolved.  Call Back If  Pus increases in amount  Pus in corner of eye lasts over 3 days  Eyelid becomes red or swollen  You become worse.  Patient Will Follow Care Advice:  YES

## 2014-04-28 NOTE — Telephone Encounter (Signed)
Ok to send drops if not sent. Advise she see a doctor immediately if vision changes, headache, worsening or persisting.

## 2014-04-28 NOTE — Telephone Encounter (Signed)
I called [facility] and per the pharmacy Polytrim eye drops were sent in and the pt has picked up this Rx.

## 2014-05-02 ENCOUNTER — Encounter: Payer: Self-pay | Admitting: Physician Assistant

## 2014-05-02 ENCOUNTER — Ambulatory Visit (INDEPENDENT_AMBULATORY_CARE_PROVIDER_SITE_OTHER): Payer: 59 | Admitting: Physician Assistant

## 2014-05-02 VITALS — BP 102/70 | HR 66 | Temp 98.4°F | Resp 18 | Wt 120.9 lb

## 2014-05-02 DIAGNOSIS — H109 Unspecified conjunctivitis: Secondary | ICD-10-CM

## 2014-05-02 MED ORDER — CIPROFLOXACIN HCL 0.3 % OP SOLN: OPHTHALMIC | Status: DC

## 2014-05-02 NOTE — Progress Notes (Signed)
Pre visit review using our clinic review tool, if applicable. No additional management support is needed unless otherwise documented below in the visit note. 

## 2014-05-02 NOTE — Patient Instructions (Addendum)
Cipro eyedrops 1-2 drops into the affected eye 4 times daily for 5-7 days. Make sure to use for at least 5 days.  If emergency symptoms discussed during visit developed, seek medical attention immediately.  Followup as needed, or for worsening or persistent symptoms despite treatment.    Conjunctivitis Conjunctivitis is commonly called "pink eye." Conjunctivitis can be caused by bacterial or viral infection, allergies, or injuries. There is usually redness of the lining of the eye, itching, discomfort, and sometimes discharge. There may be deposits of matter along the eyelids. A viral infection usually causes a watery discharge, while a bacterial infection causes a yellowish, thick discharge. Pink eye is very contagious and spreads by direct contact. You may be given antibiotic eyedrops as part of your treatment. Before using your eye medicine, remove all drainage from the eye by washing gently with warm water and cotton balls. Continue to use the medication until you have awakened 2 mornings in a row without discharge from the eye. Do not rub your eye. This increases the irritation and helps spread infection. Use separate towels from other household members. Wash your hands with soap and water before and after touching your eyes. Use cold compresses to reduce pain and sunglasses to relieve irritation from light. Do not wear contact lenses or wear eye makeup until the infection is gone. SEEK MEDICAL CARE IF:   Your symptoms are not better after 3 days of treatment.  You have increased pain or trouble seeing.  The outer eyelids become very red or swollen. Document Released: 09/18/2004 Document Revised: 11/03/2011 Document Reviewed: 08/11/2005 [facility] Patient Information 2015 [place], [place]. This information is not intended to replace advice given to you by your health care provider. Make sure you discuss any questions you have with your health care provider.

## 2014-05-02 NOTE — Progress Notes (Signed)
Subjective:    Patient ID: [name], female    DOB: 07-27-1981, 33 y.o.   MRN: [id number]  Eye Problem  Both eyes are affected.This is a new problem. The current episode started in the past 7 days (5 days). The problem occurs constantly. The problem has been unchanged. Injury mechanism: Daughter had pink eye last week, cleared with polytrim drops. The pain is at a severity of 0/10. The patient is experiencing no pain. There is known exposure to pink eye. She wears contacts. Associated symptoms include an eye discharge (yellow discharge), eye redness, itching and nausea. Pertinent negatives include no blurred vision, double vision, fever, foreign body sensation, photophobia, recent URI or vomiting. She has tried eye drops (using polytrim drops, 4 times per day.) for the symptoms. The treatment provided no relief.      Review of Systems  Constitutional: Negative for fever and chills.  Eyes: Positive for discharge (yellow discharge), redness and itching. Negative for blurred vision, double vision, photophobia, pain and visual disturbance.  Respiratory: Negative for shortness of breath.   Cardiovascular: Negative for chest pain.  Gastrointestinal: Positive for nausea. Negative for vomiting, abdominal pain and diarrhea.  Skin: Positive for itching.  Neurological: Negative for syncope and headaches.  All other systems reviewed and are negative.   Past Medical History  Diagnosis Date  . Anxiety   . Blood in stool   . Chicken pox   . Urinary tract infection   . Gestational diabetes     gestational diabetes    History   Social History  . Marital Status: Married    Spouse Name: N/A    Number of Children: N/A  . Years of Education: N/A   Occupational History  . Not on file.   Social History Main Topics  . Smoking status: Never Smoker   . Smokeless tobacco: Not on file  . Alcohol Use: Yes     Comment: occasional  . Drug Use: No  . Sexual Activity: Not on file   Other Topics  Concern  . Not on file   Social History Narrative   Work or School: [employer] Situation: Lives with two children and husband      Spiritual Beliefs: Catholic      Lifestyle: no regular exercise; diet is goo             Past Surgical History  Procedure Laterality Date  . Cesarean section      7829,5621    Family History  Problem Relation Age of Onset  . Diabetes Paternal Grandfather     No Known Allergies  Current Outpatient Prescriptions on File Prior to Visit  Medication Sig Dispense Refill  . OLANZapine (ZYPREXA) 2.5 MG tablet Take 1 every 2 days  60 tablet  0  . [DISCONTINUED] sertraline (ZOLOFT) 50 MG tablet Take 50 mg by mouth daily.       No current facility-administered medications on file prior to visit.    EXAM: BP 102/70  Pulse 66  Temp(Src) 98.4 F (36.9 C) (Oral)  Resp 18  Wt 120 lb 14.4 oz (54.84 kg)     Objective:   Physical Exam  Nursing note and vitals reviewed. Constitutional: She is oriented to person, place, and time. She appears well-developed and well-nourished. No distress.  HENT:  Head: Normocephalic and atraumatic.  Eyes: EOM are normal. Pupils are equal, round, and reactive to light. Right eye exhibits discharge. Left eye exhibits discharge.  Bilateral  eyes: Conjunctiva erythematous, thick yellow discharge with matting. No periorbital edema. Vision grossly intact bilaterally.  Cardiovascular: Normal rate, regular rhythm and intact distal pulses.   Pulmonary/Chest: Effort normal and breath sounds normal. No respiratory distress. She has no wheezes. She has no rales. She exhibits no tenderness.  Neurological: She is alert and oriented to person, place, and time.  Skin: Skin is warm and dry. She is not diaphoretic. No pallor.  Psychiatric: She has a normal mood and affect. Her behavior is normal. Judgment and thought content normal.    Lab Results  Component Value Date   WBC 5.8 12/25/2011   HGB 13.9 12/25/2011   HCT 41.0  12/25/2011   PLT 261 12/25/2011   GLUCOSE 101* 12/25/2011   NA 139 12/25/2011   K 4.0 12/25/2011   CL 107 12/25/2011   CREATININE 0.80 12/25/2011   BUN 7 12/25/2011   CO2 22 02/17/2011         Assessment & Plan:  [name] was seen today for conjunctivitis.  Diagnoses and associated orders for this visit:  Conjunctivitis unspecified Comments: polytrim ineffective, will switch to Cipro drops. watchful waiting. - ciprofloxacin (CILOXAN) 0.3 % ophthalmic solution; 1-2 drops into the affected eye 4 times daily for 5-7 days.    Pt encouraged to increase fluid hydration along with abx drops.  Return precautions provided, and patient handout on conjunctivitis.  Plan to follow up as needed, or for worsening or persistent symptoms despite treatment.  Patient Instructions  Cipro eyedrops 1-2 drops into the affected eye 4 times daily for 5-7 days. Make sure to use for at least 5 days.  If emergency symptoms discussed during visit developed, seek medical attention immediately.  Followup as needed, or for worsening or persistent symptoms despite treatment.

## 2014-06-26 ENCOUNTER — Ambulatory Visit (INDEPENDENT_AMBULATORY_CARE_PROVIDER_SITE_OTHER): Payer: 59 | Admitting: Family Medicine

## 2014-06-26 ENCOUNTER — Encounter: Payer: Self-pay | Admitting: Family Medicine

## 2014-06-26 VITALS — BP 108/68 | HR 83 | Temp 98.0°F | Ht 61.5 in | Wt 122.0 lb

## 2014-06-26 DIAGNOSIS — Z23 Encounter for immunization: Secondary | ICD-10-CM

## 2014-06-26 DIAGNOSIS — Z0000 Encounter for general adult medical examination without abnormal findings: Secondary | ICD-10-CM

## 2014-06-26 LAB — LIPID PANEL
CHOL/HDL RATIO: 2
Cholesterol: 173 mg/dL (ref 0–200)
HDL: 96.3 mg/dL (ref >39.00)
LDL Cholesterol: 67 mg/dL (ref 0–99)
NONHDL: 76.7
Triglycerides: 48 mg/dL (ref 0.0–149.0)
VLDL: 9.6 mg/dL (ref 0.0–40.0)

## 2014-06-26 LAB — HEMOGLOBIN A1C: Hgb A1c MFr Bld: 5.2 % (ref 4.6–6.5)

## 2014-06-26 NOTE — Progress Notes (Signed)
No chief complaint on file.   HPI:  Here for CPE:  -Concerns and/or follow up today: none  -Diet: variety of foods, balance and well rounded  -Exercise:no regular exercise   -Taking folic acid, vitamin D or calcium: no  -Diabetes and Dyslipidemia Screening: doing yearly, had gestational diatbetes  -Hx of HTN: no  -Vaccines: got flu today, will check with gyn on tdap - thinks had 3 years ago  -pap history: last year, does with Dr. Edwyna Ready in [place], has appt.   -FDLMP: Oct 20th  -sexual activity: yes, female partner, no new partners  -wants STI testing: no  -FH breast, colon or ovarian ca: see FH Last mammogram: n/a Last colon cancer screening:n/a  Breast Ca Risk Assessment: -breast health with gyn  -Alcohol, Tobacco, drug use: see social history  Review of Systems - no fevers, unintentional weight loss, vision loss, hearing loss, chest pain, sob, hemoptysis, melena, hematochezia, hematuria, genital discharge, changing or concerning skin lesions, bleeding, bruising, loc, thoughts of self harm or SI  Past Medical History  Diagnosis Date  . Anxiety   . Blood in stool   . Chicken pox   . Urinary tract infection   . Gestational diabetes     gestational diabetes    Past Surgical History  Procedure Laterality Date  . Cesarean section      [id number]    Family History  Problem Relation Age of Onset  . Diabetes Paternal Grandfather     History   Social History  . Marital Status: Married    Spouse Name: N/A    Number of Children: N/A  . Years of Education: N/A   Social History Main Topics  . Smoking status: Never Smoker   . Smokeless tobacco: None  . Alcohol Use: Yes     Comment: occasional  . Drug Use: No  . Sexual Activity: None   Other Topics Concern  . None   Social History Narrative   Work or School: [employer] Situation: Lives with two children and husband      Spiritual Beliefs: Catholic      Lifestyle: no regular exercise; diet  is good             Current outpatient prescriptions: [DISCONTINUED] sertraline (ZOLOFT) 50 MG tablet, Take 50 mg by mouth daily., Disp: , Rfl:   EXAM:  Filed Vitals:   06/26/14 0817  BP: 108/68  Pulse: 83  Temp: 98 F (36.7 C)    GENERAL: vitals reviewed and listed below, alert, oriented, appears well hydrated and in no acute distress  HEENT: head atraumatic, PERRLA, normal appearance of eyes, ears, nose and mouth. moist mucus membranes.  NECK: supple, no masses or lymphadenopathy  LUNGS: clear to auscultation bilaterally, no rales, rhonchi or wheeze  CV: HRRR, no peripheral edema or cyanosis, normal pedal pulses  BREAST: declined  ABDOMEN: bowel sounds normal, soft, non tender to palpation, no masses, no rebound or guarding  GU: declined  RECTAL: refused  SKIN: no rash or abnormal lesions  MS: normal gait, moves all extremities normally  NEURO: CN II-XII grossly intact, normal muscle strength and sensation to light touch on extremities  PSYCH: normal affect, pleasant and cooperative  ASSESSMENT AND PLAN:  Discussed the following assessment and plan:  There are no diagnoses linked to this encounter.  -Discussed and advised all [place] preventive services health task force level A and B recommendations for age, sex and risks.  -Advised at least 150  minutes of exercise per week and a healthy diet low in saturated fats and sweets and consisting of fresh fruits and vegetables, lean meats such as fish and white chicken and whole grains.  -FASTING labs, studies and vaccines per orders this encounter  Orders Placed This Encounter  Procedures  . Lipid Panel  . Hemoglobin A1c    Patient advised to return to clinic immediately if symptoms worsen or persist or new concerns.  Patient Instructions  BEFORE YOU LEAVE: -labs  -check with your obstetrician if you had the Tdap 3 years ago - if not you can get it there, please let us know either way  -folic acid  daily  -Vit D3 [phone] IU daily; calcium 1200mg  daily - most probably comes from your diet, only take extra calcium if not getting enough in diet  We recommend the following healthy lifestyle measures: - eat a healthy diet consisting of lots of vegetables, fruits, beans, nuts, seeds, healthy meats such as white chicken and fish and whole grains.  - avoid fried foods, fast food, processed foods, sodas, red meet and other fattening foods.  - get a least 150 minutes of aerobic exercise per week.   -We have ordered labs or studies at this visit. It can take up to 1-2 weeks for results and processing. We will contact you with instructions IF your results are abnormal. Normal results will be released to your [facility]. If you have not heard from [place] or can not find your results in [facility] in 2 weeks please contact our office.             Return in about 1 year (around 06/27/2015) for CPE.  Lucretia Kern.

## 2014-06-26 NOTE — Patient Instructions (Addendum)
BEFORE YOU LEAVE: -labs  -check with your obstetrician if you had the Tdap 3 years ago - if not you can get it there, please let us know either way  -folic acid daily  -Vit D3 [phone] IU daily; calcium 1200mg  daily - most probably comes from your diet, only take extra calcium if not getting enough in diet  We recommend the following healthy lifestyle measures: - eat a healthy diet consisting of lots of vegetables, fruits, beans, nuts, seeds, healthy meats such as white chicken and fish and whole grains.  - avoid fried foods, fast food, processed foods, sodas, red meet and other fattening foods.  - get a least 150 minutes of aerobic exercise per week.   -We have ordered labs or studies at this visit. It can take up to 1-2 weeks for results and processing. We will contact you with instructions IF your results are abnormal. Normal results will be released to your [facility]. If you have not heard from [place] or can not find your results in [facility] in 2 weeks please contact our office.

## 2014-06-26 NOTE — Addendum Note (Signed)
Addended by: Agnes Lawrence on: 06/26/2014 09:18 AM   Modules accepted: Orders

## 2014-06-26 NOTE — Progress Notes (Signed)
Pre visit review using our clinic review tool, if applicable. No additional management support is needed unless otherwise documented below in the visit note. 

## 2014-07-25 ENCOUNTER — Telehealth: Payer: Self-pay | Admitting: Family Medicine

## 2014-07-25 NOTE — Telephone Encounter (Signed)
Noted  

## 2014-07-25 NOTE — Telephone Encounter (Signed)
Patient Information:  Caller Name: Jasmarie  Phone: [phone]  Patient: [name], [name]  Gender: Female  DOB: [birth year]  Age: 33 Years  PCP: Maudie Mercury (TEXT 1st, after 20 mins can call), [place] [facility])  Pregnant: No  Office Follow Up:  Does the office need to follow up with this patient?: No  Instructions For The Office: N/A   Symptoms  Reason For Call & Symptoms: Has vaginal discharge since 11--27. Is brown to white in color. Has itching. Afebrile. No odor to discharge.  Reviewed Health History In EMR: Yes  Reviewed Medications In EMR: Yes  Reviewed Allergies In EMR: Yes  Reviewed Surgeries / Procedures: Yes  Date of Onset of Symptoms: 07/21/2014 OB / GYN:  LMP: 07/14/2014  Guideline(s) Used:  Vaginal Discharge  Disposition Per Guideline:   See Within 3 Days in Office  Reason For Disposition Reached:   Symptoms of a yeast infection" (i.e., itchy, white discharge, not bad smelling) and not improved > 3 days following Care Advice  Advice Given:  N/A  Patient Refused Recommendation:  Patient Will Make Own Appointment  Wants to "wait and see" if improves.

## 2014-10-26 ENCOUNTER — Telehealth: Payer: Self-pay | Admitting: Family Medicine

## 2014-10-26 NOTE — Telephone Encounter (Signed)
Please advise 

## 2014-10-26 NOTE — Telephone Encounter (Signed)
Pt said she has anxiety on and off and is asking if Dr Maudie Mercury can recommend someone. She said she has seen Richardo Priest but she would like someone that specialize . May leave message on cell phone.

## 2014-10-26 NOTE — Telephone Encounter (Signed)
Pt does not want to take any medication. She just wants therapist. Advised her to schedule appointment with Irven Shelling with [facility], as that is what was recommended by Dr. Maudie Mercury.  Contact info given to pt for scheduling

## 2014-11-01 ENCOUNTER — Encounter: Payer: Self-pay | Admitting: Internal Medicine

## 2014-11-01 ENCOUNTER — Ambulatory Visit (INDEPENDENT_AMBULATORY_CARE_PROVIDER_SITE_OTHER): Payer: 59 | Admitting: Internal Medicine

## 2014-11-01 VITALS — BP 110/70 | HR 92 | Temp 98.5°F | Resp 18 | Ht 61.5 in | Wt 121.0 lb

## 2014-11-01 DIAGNOSIS — B9789 Other viral agents as the cause of diseases classified elsewhere: Principal | ICD-10-CM

## 2014-11-01 DIAGNOSIS — J069 Acute upper respiratory infection, unspecified: Secondary | ICD-10-CM

## 2014-11-01 MED ORDER — HYDROCODONE-HOMATROPINE 5-1.5 MG/5ML PO SYRP: 5.0000 mL | ORAL_SOLUTION | Freq: Four times a day (QID) | ORAL | Status: DC | PRN

## 2014-11-01 NOTE — Progress Notes (Signed)
Subjective:    Patient ID: [name], female    DOB: 19-Sep-1980, 34 y.o.   MRN: [id number]  HPI  34 year old patient who presents with a 2 day history of cough, congestion, malaise and general sense of unwellness.  No documented fever.  She does have a young daughter who also has had an acute febrile illness.  Cough is quite bothersome and interferes with sleep.  She has missed 2 days of work.  Past Medical History  Diagnosis Date  . Anxiety   . Blood in stool   . Chicken pox   . Urinary tract infection   . Gestational diabetes     gestational diabetes    History   Social History  . Marital Status: Married    Spouse Name: N/A  . Number of Children: N/A  . Years of Education: N/A   Occupational History  . Not on file.   Social History Main Topics  . Smoking status: Never Smoker   . Smokeless tobacco: Not on file  . Alcohol Use: Yes     Comment: occasional  . Drug Use: No  . Sexual Activity: Not on file   Other Topics Concern  . Not on file   Social History Narrative   Work or School: [employer] Situation: Lives with two children and husband      Spiritual Beliefs: Catholic      Lifestyle: no regular exercise; diet is good             Past Surgical History  Procedure Laterality Date  . Cesarean section      [id number]    Family History  Problem Relation Age of Onset  . Diabetes Paternal Grandfather     No Known Allergies  Current Outpatient Prescriptions on File Prior to Visit  Medication Sig Dispense Refill  . [DISCONTINUED] sertraline (ZOLOFT) 50 MG tablet Take 50 mg by mouth daily.     No current facility-administered medications on file prior to visit.    BP 110/70 mmHg  Pulse 92  Temp(Src) 98.5 F (36.9 C) (Oral)  Resp 18  Ht 5' 1.5" (1.562 m)  Wt 121 lb (54.885 kg)  BMI 22.50 kg/m2  SpO2 98%  LMP 10/04/2014     Review of Systems  Constitutional: Positive for activity change, appetite change and fatigue.  HENT:  Positive for congestion. Negative for dental problem, hearing loss, rhinorrhea, sinus pressure, sore throat and tinnitus.   Eyes: Negative for pain, discharge and visual disturbance.  Respiratory: Positive for cough. Negative for shortness of breath.   Cardiovascular: Negative for chest pain, palpitations and leg swelling.  Gastrointestinal: Negative for nausea, vomiting, abdominal pain, diarrhea, constipation, blood in stool and abdominal distention.  Genitourinary: Negative for dysuria, urgency, frequency, hematuria, flank pain, vaginal bleeding, vaginal discharge, difficulty urinating, vaginal pain and pelvic pain.  Musculoskeletal: Negative for joint swelling, arthralgias and gait problem.  Skin: Negative for rash.  Neurological: Negative for dizziness, syncope, speech difficulty, weakness, numbness and headaches.  Hematological: Negative for adenopathy.  Psychiatric/Behavioral: Negative for behavioral problems, dysphoric mood and agitation. The patient is not nervous/anxious.        Objective:   Physical Exam  Constitutional: She is oriented to person, place, and time. She appears well-developed and well-nourished.  HENT:  Head: Normocephalic.  Right Ear: External ear normal.  Left Ear: External ear normal.  Mouth/Throat: Oropharynx is clear and moist.  Eyes: Conjunctivae and EOM are normal. Pupils are equal,  round, and reactive to light.  Neck: Normal range of motion. Neck supple. No thyromegaly present.  Cardiovascular: Normal rate, regular rhythm, normal heart sounds and intact distal pulses.   Pulmonary/Chest: Effort normal and breath sounds normal. No respiratory distress. She has no wheezes. She has no rales.  Abdominal: Soft. Bowel sounds are normal. She exhibits no mass. There is no tenderness.  Musculoskeletal: Normal range of motion.  Lymphadenopathy:    She has no cervical adenopathy.  Neurological: She is alert and oriented to person, place, and time.  Skin: Skin is  warm and dry. No rash noted.  Psychiatric: She has a normal mood and affect. Her behavior is normal.          Assessment & Plan:   Viral URI with cough.  Will treat symptomatically

## 2014-11-01 NOTE — Progress Notes (Signed)
Pre visit review using our clinic review tool, if applicable. No additional management support is needed unless otherwise documented below in the visit note. 

## 2014-11-01 NOTE — Patient Instructions (Signed)
Acute bronchitis symptoms for less than 10 days are generally not helped by antibiotics.  Take over-the-counter expectorants and cough medications such as  Mucinex DM.  Call if there is no improvement in 5 to 7 days or if  you develop worsening cough, fever, or new symptoms, such as shortness of breath or chest pain.  Acute bronchitis usually goes away in a couple weeks. Oftentimes, no medical treatment is necessary. Medicines are sometimes given for relief of fever or cough. Antibiotic medicines are usually not needed but may be prescribed in certain situations. In some cases, an inhaler may be recommended to help reduce shortness of breath and control the cough. A cool mist vaporizer may also be used to help thin bronchial secretions and make it easier to clear the chest.   HOME CARE INSTRUCTIONS  Get plenty of rest.  Drink enough fluids to keep your urine clear or pale yellow (unless you have a medical condition that requires fluid restriction). Increasing fluids may help thin your respiratory secretions (sputum) and reduce chest congestion, and it will prevent dehydration.  Take medicines only as directed by your health care provider.  Reduce the chances of another bout of acute bronchitis by washing your hands frequently, avoiding people with cold symptoms, and trying not to touch your hands to your mouth, nose, or eyes.

## 2015-07-02 ENCOUNTER — Encounter: Payer: Self-pay | Admitting: Family Medicine

## 2015-07-02 ENCOUNTER — Ambulatory Visit (INDEPENDENT_AMBULATORY_CARE_PROVIDER_SITE_OTHER): Payer: 59 | Admitting: Family Medicine

## 2015-07-02 VITALS — BP 102/72 | HR 74 | Temp 98.3°F | Ht 61.5 in | Wt 126.8 lb

## 2015-07-02 DIAGNOSIS — Z0000 Encounter for general adult medical examination without abnormal findings: Secondary | ICD-10-CM | POA: Diagnosis not present

## 2015-07-02 DIAGNOSIS — Z23 Encounter for immunization: Secondary | ICD-10-CM

## 2015-07-02 DIAGNOSIS — Z8632 Personal history of gestational diabetes: Secondary | ICD-10-CM | POA: Diagnosis not present

## 2015-07-02 LAB — HEMOGLOBIN A1C: Hgb A1c MFr Bld: 5.3 % (ref 4.6–6.5)

## 2015-07-02 NOTE — Progress Notes (Signed)
Pre visit review using our clinic review tool, if applicable. No additional management support is needed unless otherwise documented below in the visit note. 

## 2015-07-02 NOTE — Patient Instructions (Signed)
BEFORE YOU LEAVE: -flu shot -labs -schedule physical yearly  -We have ordered labs or studies at this visit. It can take up to 1-2 weeks for results and processing. We will contact you with instructions IF your results are abnormal. Normal results will be released to your [facility]. If you have not heard from [place] or can not find your results in [facility] in 2 weeks please contact our office.  We recommend the following healthy lifestyle measures: - eat a healthy whole foods diet consisting of regular small meals composed of vegetables, fruits, beans, nuts, seeds, healthy meats such as white chicken and fish and whole grains.  - avoid sweets, white starchy foods, fried foods, fast food, processed foods, sodas, red meet and other fattening foods.  - get a least 150-300 minutes of aerobic exercise per week.

## 2015-07-02 NOTE — Progress Notes (Signed)
HPI:  Here for CPE:  -Concerns and/or follow up today:  Wants to check hgba1c - hx of gestational diabetes, no symptoms. Does not eat sweets or many simple carbs.  -Diet: variety of foods, balance and well rounded, larger portion sizes  -Exercise: no regular exercise  -Taking folic acid, vitamin D or calcium: yes  -Diabetes and Dyslipidemia Screening: lipids done last year.  -Hx of HTN: no  -Vaccines: wants flu shot today  -pap history: does with gyn, done in 2015, seeing gyn next week   -FDLMP: see nursing notes  -sexual activity: yes, female partner, no new partners  -wants STI testing (Hep C if born [birth year]): no  -sees dermatologist yearly for a skin check, no hx of skin cancer  -Alcohol, Tobacco, drug use: see social history  Review of Systems - no fevers, unintentional weight loss, vision loss, hearing loss, chest pain, sob, hemoptysis, melena, hematochezia, hematuria, genital discharge, changing or concerning skin lesions, bleeding, bruising, loc, thoughts of self harm or SI  Past Medical History  Diagnosis Date  . Anxiety     on zyprexa in the past - stopped medication in 2015  . Blood in stool   . Chicken pox   . Urinary tract infection   . Gestational diabetes     gestational diabetes  . Heart murmur     and palpitations, s/p eval with cardiology in 2014    Past Surgical History  Procedure Laterality Date  . Cesarean section      [id number]    Family History  Problem Relation Age of Onset  . Diabetes Paternal Grandfather     Social History   Social History  . Marital Status: Married    Spouse Name: N/A  . Number of Children: N/A  . Years of Education: N/A   Social History Main Topics  . Smoking status: Never Smoker   . Smokeless tobacco: None  . Alcohol Use: Yes     Comment: occasional  . Drug Use: No  . Sexual Activity: Not Asked   Other Topics Concern  . None   Social History Narrative   Work or School: [employer]  Situation: Lives with two children and husband      Spiritual Beliefs: Catholic      Lifestyle: no regular exercise; diet is good              Current outpatient prescriptions:  .  [DISCONTINUED] sertraline (ZOLOFT) 50 MG tablet, Take 50 mg by mouth daily., Disp: , Rfl:   EXAM:  Filed Vitals:   07/02/15 0814  BP: 102/72  Pulse: 74  Temp: 98.3 F (36.8 C)    GENERAL: vitals reviewed and listed below, alert, oriented, appears well hydrated and in no acute distress  HEENT: head atraumatic, PERRLA, normal appearance of eyes, ears, nose and mouth. moist mucus membranes.  NECK: supple, no masses or lymphadenopathy  LUNGS: clear to auscultation bilaterally, no rales, rhonchi or wheeze  CV: HRRR, no peripheral edema or cyanosis, normal pedal pulses  BREAST: declined, does with gyn  ABDOMEN: bowel sounds normal, soft, non tender to palpation, no masses, no rebound or guarding  GU: declined, does with gyn  RECTAL: refused  SKIN: no rash or abnormal lesions on exposed portions of skin, declined full skin check, sees dermatologist for skin exam yearly  MS: normal gait, moves all extremities normally  NEURO: CN II-XII grossly intact, normal muscle strength and sensation to light touch on extremities  PSYCH: normal affect, pleasant and cooperative  ASSESSMENT AND PLAN:  Discussed the following assessment and plan:  Visit for preventive health examination  Hx gestational diabetes - Plan: Hemoglobin A1c  -Discussed and advised all [place] preventive services health task force level A and B recommendations for age, sex and risks.  -Advised at least 150 minutes of exercise per week and a healthy diet low in saturated fats and sweets and consisting of fresh fruits and vegetables, lean meats such as fish and white chicken and whole grains.  -labs, studies and vaccines per orders this encounter  Orders Placed This Encounter  Procedures  . Hemoglobin A1c    Patient advised  to return to clinic immediately if symptoms worsen or persist or new concerns.  Patient Instructions  BEFORE YOU LEAVE: -flu shot -labs -schedule physical yearly  -We have ordered labs or studies at this visit. It can take up to 1-2 weeks for results and processing. We will contact you with instructions IF your results are abnormal. Normal results will be released to your [facility]. If you have not heard from [place] or can not find your results in [facility] in 2 weeks please contact our office.  We recommend the following healthy lifestyle measures: - eat a healthy whole foods diet consisting of regular small meals composed of vegetables, fruits, beans, nuts, seeds, healthy meats such as white chicken and fish and whole grains.  - avoid sweets, white starchy foods, fried foods, fast food, processed foods, sodas, red meet and other fattening foods.  - get a least 150-300 minutes of aerobic exercise per week.            No Follow-up on file.  Colin Benton R.

## 2015-08-15 ENCOUNTER — Telehealth: Payer: Self-pay | Admitting: Family Medicine

## 2015-08-15 NOTE — Telephone Encounter (Signed)
Noted  

## 2015-08-15 NOTE — Telephone Encounter (Signed)
[place]  Patient Name: [name]  DOB: [birth year]    Initial Comment Caller states she has been a little light headed on and off for past few days. May not be able to answer due to being at work. Requested to leave a message.   Nurse Assessment  Nurse: Genoveva Ill, RN, Lattie Haw Date/Time (Eastern Time): 08/15/2015 3:54:24 PM  Confirm and document reason for call. If symptomatic, describe symptoms. ---Caller states she has been a little light headed on and off for past few days  Has the patient traveled out of the country within the last 30 days? ---Not Applicable  Does the patient have any new or worsening symptoms? ---Yes  Will a triage be completed? ---Yes  Related visit to physician within the last 2 weeks? ---No  Does the PT have any chronic conditions? (i.e. diabetes, asthma, etc.) ---No  Did the patient indicate they were pregnant? ---No  Is this a behavioral health or substance abuse call? ---No     Guidelines    Guideline Title Affirmed Question Affirmed Notes  Dizziness - Lightheadedness [1] MILD dizziness (e.g., walking normally) AND [2] has NOT been evaluated by physician for this (Exception: dizziness caused by heat exposure, sudden standing, or poor fluid intake)    Final Disposition User   See PCP When Office is Open (within 3 days) [place], RN, Lattie Haw    Comments  Appt scheduled tomorrow 08/16/15 at 4:30 pm with Dr. Yong Channel   Referrals  REFERRED TO PCP OFFICE   Disagree/Comply: Comply

## 2015-08-15 NOTE — Telephone Encounter (Signed)
Called nurse line and they called her back and left a message. Patient called the nurse line back and they transferred to office. Patient is experiencing lighted headiness off and on for the past few day. Patient states that she will check her blood pressure will call back tomorrow and schedule if she is feeling light headed.

## 2015-08-16 ENCOUNTER — Ambulatory Visit: Payer: 59 | Admitting: Family Medicine

## 2015-08-31 DIAGNOSIS — H52223 Regular astigmatism, bilateral: Secondary | ICD-10-CM | POA: Diagnosis not present

## 2015-08-31 DIAGNOSIS — H5213 Myopia, bilateral: Secondary | ICD-10-CM | POA: Diagnosis not present

## 2015-10-13 DIAGNOSIS — B889 Infestation, unspecified: Secondary | ICD-10-CM | POA: Diagnosis not present

## 2016-03-03 ENCOUNTER — Encounter: Payer: Self-pay | Admitting: Family Medicine

## 2016-03-03 ENCOUNTER — Ambulatory Visit (INDEPENDENT_AMBULATORY_CARE_PROVIDER_SITE_OTHER): Payer: 59 | Admitting: Family Medicine

## 2016-03-03 VITALS — BP 116/70 | HR 79 | Temp 98.6°F | Ht 61.5 in | Wt 129.8 lb

## 2016-03-03 DIAGNOSIS — R42 Dizziness and giddiness: Secondary | ICD-10-CM

## 2016-03-03 MED ORDER — FLUTICASONE PROPIONATE 50 MCG/ACT NA SUSP: 2.0000 | Freq: Every day | NASAL | Status: DC

## 2016-03-03 NOTE — Patient Instructions (Signed)
BEFORE YOU LEAVE: -follow up: labs -follow up or call in 3-4 weeks  AFRIN nasal spray twice daily for 3 days  flonase 2 sprays each nostril daily for 21 days  Do not drive if feeling dizzy.

## 2016-03-03 NOTE — Progress Notes (Signed)
HPI:  Acute visit for ? Vertigo: -for a few weeks -intermittent, can resolve for a few days then recur - feels like trying to remain still helps, but not sure if movements trigger, not worse with standing -brief episodes of feeling like is off balance with ? Mild nausea -no fevers, malaise, HA, sinus pain or pressure, falls, syncope, tooth pain, hearing loss or tinnitus -she wants to check labs, is anxious about it  ROS: See pertinent positives and negatives per HPI.  Past Medical History  Diagnosis Date  . Anxiety     on zyprexa in the past - stopped medication in 2015  . Blood in stool   . Chicken pox   . Urinary tract infection   . Gestational diabetes     gestational diabetes  . Heart murmur     and palpitations, s/p eval with cardiology in 2014    Past Surgical History  Procedure Laterality Date  . Cesarean section      [id number]    Family History  Problem Relation Age of Onset  . Diabetes Paternal Grandfather     Social History   Social History  . Marital Status: Married    Spouse Name: N/A  . Number of Children: N/A  . Years of Education: N/A   Social History Main Topics  . Smoking status: Never Smoker   . Smokeless tobacco: None  . Alcohol Use: Yes     Comment: occasional  . Drug Use: No  . Sexual Activity: Not Asked   Other Topics Concern  . None   Social History Narrative   Work or School: [employer] Situation: Lives with two children and husband      Spiritual Beliefs: Catholic      Lifestyle: no regular exercise; diet is good              Current outpatient prescriptions:  Marland Kitchen  Multiple Vitamin (MULTIVITAMIN) capsule, Take 1 capsule by mouth daily., Disp: , Rfl:  .  [DISCONTINUED] sertraline (ZOLOFT) 50 MG tablet, Take 50 mg by mouth daily., Disp: , Rfl:   EXAM:  Filed Vitals:   03/03/16 1632  BP: 116/70  Pulse: 79  Temp: 98.6 F (37 C)    Body mass index is 24.13 kg/(m^2).  GENERAL: vitals reviewed and listed  above, alert, oriented, appears well hydrated and in no acute distress  HEENT: atraumatic, conjunttiva clear, PERRLA, no obvious abnormalities on inspection of external nose and ears, normal appearance of ear canals and TMs with some clear fluid behind both TMs, clear nasal congestion, mild post oropharyngeal erythema with PND, no tonsillar edema or exudate, no sinus TTP  NECK: no obvious masses on inspection  LUNGS: clear to auscultation bilaterally, no wheezes, rales or rhonchi, good air movement  CV: HRRR, no peripheral edema  MS: moves all extremities without noticeable abnormality  PSYCH/Neuro: pleasant and cooperative, anxious, CN II-XII grossly intact, finger to nose normal, speech and thought processing grossly intact, gait normal, no obvious depression or anxiety, dix hallpike normal  ASSESSMENT AND PLAN:  Discussed the following assessment and plan:  Vertigo  -query mild BPPV, viral labrynthitis or ETD -opted for tx ETD and close follow up; vestibular rehab, referral and/or MRI if persists -labs oer her request -Patient advised to return or notify a doctor immediately if symptoms worsen or persist or new concerns arise.  There are no Patient Instructions on file for this visit.  Colin Benton R., DO

## 2016-03-03 NOTE — Progress Notes (Signed)
Pre visit review using our clinic review tool, if applicable. No additional management support is needed unless otherwise documented below in the visit note. 

## 2016-03-04 LAB — CBC WITH DIFFERENTIAL/PLATELET
BASOS PCT: 0.3 % (ref 0.0–3.0)
Basophils Absolute: 0 10*3/uL (ref 0.0–0.1)
EOS ABS: 0.2 10*3/uL (ref 0.0–0.7)
EOS PCT: 1.8 % (ref 0.0–5.0)
HEMATOCRIT: 38.7 % (ref 36.0–46.0)
HEMOGLOBIN: 13.2 g/dL (ref 12.0–15.0)
LYMPHS PCT: 16.7 % (ref 12.0–46.0)
Lymphs Abs: 1.4 10*3/uL (ref 0.7–4.0)
MCHC: 34.2 g/dL (ref 30.0–36.0)
MCV: 87.2 fl (ref 78.0–100.0)
Monocytes Absolute: 0.5 10*3/uL (ref 0.1–1.0)
Monocytes Relative: 5.7 % (ref 3.0–12.0)
Neutro Abs: 6.4 10*3/uL (ref 1.4–7.7)
Neutrophils Relative %: 75.5 % (ref 43.0–77.0)
Platelets: 272 10*3/uL (ref 150.0–400.0)
RBC: 4.44 Mil/uL (ref 3.87–5.11)
RDW: 13.1 % (ref 11.5–15.5)
WBC: 8.5 10*3/uL (ref 4.0–10.5)

## 2016-03-04 LAB — HEMOGLOBIN A1C: Hgb A1c MFr Bld: 5.3 % (ref 4.6–6.5)

## 2016-03-04 LAB — BASIC METABOLIC PANEL
BUN: 7 mg/dL (ref 6–23)
CO2: 24 meq/L (ref 19–32)
Calcium: 9.7 mg/dL (ref 8.4–10.5)
Chloride: 102 mEq/L (ref 96–112)
Creatinine, Ser: 0.72 mg/dL (ref 0.40–1.20)
GFR: 97.23 mL/min (ref >60.00)
GLUCOSE: 99 mg/dL (ref 70–99)
POTASSIUM: 3.8 meq/L (ref 3.5–5.1)
SODIUM: 134 meq/L — AB (ref 135–145)

## 2016-03-04 MED FILL — FLUTICASONE PROP 50 MCG SPR: 50 | 30 days supply | Qty: 16 | Fill #0

## 2016-04-16 DIAGNOSIS — D2261 Melanocytic nevi of right upper limb, including shoulder: Secondary | ICD-10-CM | POA: Diagnosis not present

## 2016-04-16 DIAGNOSIS — D225 Melanocytic nevi of trunk: Secondary | ICD-10-CM | POA: Diagnosis not present

## 2016-04-16 DIAGNOSIS — L700 Acne vulgaris: Secondary | ICD-10-CM | POA: Diagnosis not present

## 2016-04-16 DIAGNOSIS — D2271 Melanocytic nevi of right lower limb, including hip: Secondary | ICD-10-CM | POA: Diagnosis not present

## 2016-04-16 DIAGNOSIS — D2262 Melanocytic nevi of left upper limb, including shoulder: Secondary | ICD-10-CM | POA: Diagnosis not present

## 2016-06-20 DIAGNOSIS — H10413 Chronic giant papillary conjunctivitis, bilateral: Secondary | ICD-10-CM | POA: Diagnosis not present

## 2016-06-20 MED FILL — ALREX 0.2% EYE DROPS: 0.2 | 30 days supply | Qty: 5 | Fill #0

## 2016-07-03 ENCOUNTER — Encounter: Payer: Self-pay | Admitting: Family Medicine

## 2016-07-03 ENCOUNTER — Ambulatory Visit (INDEPENDENT_AMBULATORY_CARE_PROVIDER_SITE_OTHER): Payer: 59 | Admitting: Family Medicine

## 2016-07-03 ENCOUNTER — Encounter: Payer: 59 | Admitting: Family Medicine

## 2016-07-03 VITALS — BP 122/68 | HR 67 | Temp 97.8°F | Ht 61.5 in | Wt 132.5 lb

## 2016-07-03 DIAGNOSIS — Z23 Encounter for immunization: Secondary | ICD-10-CM | POA: Diagnosis not present

## 2016-07-03 DIAGNOSIS — Z0000 Encounter for general adult medical examination without abnormal findings: Secondary | ICD-10-CM

## 2016-07-03 LAB — LIPID PANEL
Cholesterol: 171 mg/dL (ref 0–200)
HDL: 89.1 mg/dL (ref >39.00)
LDL Cholesterol: 73 mg/dL (ref 0–99)
NONHDL: 81.89
TRIGLYCERIDES: 42 mg/dL (ref 0.0–149.0)
Total CHOL/HDL Ratio: 2
VLDL: 8.4 mg/dL (ref 0.0–40.0)

## 2016-07-03 LAB — HEMOGLOBIN A1C: Hgb A1c MFr Bld: 5.3 % (ref 4.6–6.5)

## 2016-07-03 NOTE — Progress Notes (Signed)
HPI:  Here for CPE:  -Concerns and/or follow up today: none Sees dermatologist for skin check. Vertigo resolved with treating ETD.  -Diet: variety of foods, balance and well rounded  -Exercise: no regular exercise  -Taking folic acid, vitamin D or calcium: no  -Diabetes and Dyslipidemia Screening: FASTING and wants to recheck labs has had not been exercising  -Hx of HTN: no  -Vaccines: UTD except flu shot  -pap history: sees gyn yearly  -FDLMP: see assistant notes  -sexual activity: yes, female partner, no new partners  -wants STI testing (Hep C if born [birth year]): no  -FH breast, colon or ovarian ca: see FH Last mammogram: n/a Last colon cancer screening: n/a  Breast Ca Risk Assessment: -no sig personal or family hx   -Alcohol, Tobacco, drug use: see social history  Review of Systems - no fevers, unintentional weight loss, vision loss, hearing loss, chest pain, sob, hemoptysis, melena, hematochezia, hematuria, genital discharge, changing or concerning skin lesions, bleeding, bruising, loc, thoughts of self harm or SI  Past Medical History:  Diagnosis Date  . Anxiety    on zyprexa in the past - stopped medication in 2015  . Blood in stool   . Chicken pox   . Gestational diabetes    gestational diabetes  . Heart murmur    and palpitations, s/p eval with cardiology in 2014  . Urinary tract infection     Past Surgical History:  Procedure Laterality Date  . CESAREAN SECTION     [id number]    Family History  Problem Relation Age of Onset  . Diabetes Paternal Grandfather     Social History   Social History  . Marital status: Married    Spouse name: N/A  . Number of children: N/A  . Years of education: N/A   Social History Main Topics  . Smoking status: Never Smoker  . Smokeless tobacco: None  . Alcohol use Yes     Comment: occasional  . Drug use: No  . Sexual activity: Not Asked   Other Topics Concern  . None   Social History Narrative   Work  or School: [employer] Situation: Lives with two children and husband      Spiritual Beliefs: Catholic      Lifestyle: no regular exercise; diet is good              Current Outpatient Prescriptions:  Marland Kitchen  Multiple Vitamin (MULTIVITAMIN) capsule, Take 1 capsule by mouth daily., Disp: , Rfl:   EXAM:  Vitals:   07/03/16 0820  BP: 122/68  Pulse: 67  Temp: 97.8 F (36.6 C)    GENERAL: vitals reviewed and listed below, alert, oriented, appears well hydrated and in no acute distress  HEENT: head atraumatic, PERRLA, normal appearance of eyes, ears, nose and mouth. moist mucus membranes.  NECK: supple, no masses or lymphadenopathy  LUNGS: clear to auscultation bilaterally, no rales, rhonchi or wheeze  CV: HRRR, no peripheral edema or cyanosis, normal pedal pulses  BREAST: does with gyn  ABDOMEN: bowel sounds normal, soft, non tender to palpation, no masses, no rebound or guarding  GU: does with gyn  SKIN: no rash or abnormal lesions, full skin check done with dermatologist  MS: normal gait, moves all extremities normally  NEURO: normal gait, speech and thought processing grossly intact, muscle tone grossly intact throughout  PSYCH: normal affect, pleasant and cooperative  ASSESSMENT AND PLAN:  Discussed the following assessment and plan:  Encounter for  preventive health examination - Plan: Lipid Panel, Hemoglobin A1c  Encounter for immunization - Plan: Flu Vaccine QUAD 36+ mos IM   -Discussed and advised all [place] preventive services health task force level A and B recommendations for age, sex and risks.  -Advised at least 150 minutes of exercise per week and a healthy diet with avoidance of (less then 1 serving per week) processed foods, white starches, red meat, fast foods and sweets and consisting of: * 5-9 servings of fresh fruits and vegetables (not corn or potatoes) *nuts and seeds, beans *olives and olive oil *lean meats such as fish and white  chicken  *whole grains  -labs, studies and vaccines per orders this encounter  No orders of the defined types were placed in this encounter.   Patient advised to return to clinic immediately if symptoms worsen or persist or new concerns.  Patient Instructions  BEFORE YOU LEAVE: -follow up: Year and as needed Flu shot Labs  We have ordered labs or studies at this visit. It can take up to 1-2 weeks for results and processing. IF results require follow up or explanation, we will call you with instructions. Clinically stable results will be released to your [facility]. If you have not heard from [place] or cannot find your results in [facility] in 2 weeks please contact our office at [phone].   If you are not yet signed up for [facility], please SIGN UP TODAY. We now offer online scheduling, same day appointments and extended hours. WHEN YOU DON'T FEEL YOUR BEST.Marland KitchenMarland KitchenWE ARE HERE TO HELP.   We recommend the following healthy lifestyle for LIFE: 1) Eat a healthy clean diet.  * Tip: Avoid (less then 1 serving per week): processed foods, sweets, sweetened drinks, white starches (rice, flour, bread, potatoes, pasta, etc), red meat, fast foods, butter  *Tip: CHOOSE instead   * 5-9 servings per day of fresh or frozen fruits and vegetables (but not corn, potatoes, bananas, canned or dried fruit)   *nuts and seeds, beans   *olives and olive oil   *small portions of lean meats such as fish and white chicken    *small portions of whole grains  2)Get at least 150 minutes of sweaty aerobic exercise per week.  3)Reduce stress - consider counseling, meditation and relaxation to balance other aspects of your life.    No Follow-up on file.  Colin Benton R., DO

## 2016-07-03 NOTE — Patient Instructions (Signed)
BEFORE YOU LEAVE: -follow up: Year and as needed Flu shot Labs  We have ordered labs or studies at this visit. It can take up to 1-2 weeks for results and processing. IF results require follow up or explanation, we will call you with instructions. Clinically stable results will be released to your [facility]. If you have not heard from [place] or cannot find your results in [facility] in 2 weeks please contact our office at [phone].   If you are not yet signed up for [facility], please SIGN UP TODAY. We now offer online scheduling, same day appointments and extended hours. WHEN YOU DON'T FEEL YOUR BEST.Marland KitchenMarland KitchenWE ARE HERE TO HELP.   We recommend the following healthy lifestyle for LIFE: 1) Eat a healthy clean diet.  * Tip: Avoid (less then 1 serving per week): processed foods, sweets, sweetened drinks, white starches (rice, flour, bread, potatoes, pasta, etc), red meat, fast foods, butter  *Tip: CHOOSE instead   * 5-9 servings per day of fresh or frozen fruits and vegetables (but not corn, potatoes, bananas, canned or dried fruit)   *nuts and seeds, beans   *olives and olive oil   *small portions of lean meats such as fish and white chicken    *small portions of whole grains  2)Get at least 150 minutes of sweaty aerobic exercise per week.  3)Reduce stress - consider counseling, meditation and relaxation to balance other aspects of your life.

## 2016-07-03 NOTE — Progress Notes (Signed)
Pre visit review using our clinic review tool, if applicable. No additional management support is needed unless otherwise documented below in the visit note. 

## 2016-08-15 DIAGNOSIS — Z01419 Encounter for gynecological examination (general) (routine) without abnormal findings: Secondary | ICD-10-CM | POA: Diagnosis not present

## 2016-08-15 DIAGNOSIS — Z6823 Body mass index (BMI) 23.0-23.9, adult: Secondary | ICD-10-CM | POA: Diagnosis not present

## 2016-11-06 DIAGNOSIS — H5213 Myopia, bilateral: Secondary | ICD-10-CM | POA: Diagnosis not present

## 2017-01-26 DIAGNOSIS — D225 Melanocytic nevi of trunk: Secondary | ICD-10-CM | POA: Diagnosis not present

## 2017-01-26 DIAGNOSIS — L918 Other hypertrophic disorders of the skin: Secondary | ICD-10-CM | POA: Diagnosis not present

## 2017-01-26 DIAGNOSIS — L700 Acne vulgaris: Secondary | ICD-10-CM | POA: Diagnosis not present

## 2017-01-26 DIAGNOSIS — D2262 Melanocytic nevi of left upper limb, including shoulder: Secondary | ICD-10-CM | POA: Diagnosis not present

## 2017-03-10 ENCOUNTER — Telehealth: Payer: 59 | Admitting: Nurse Practitioner

## 2017-03-10 DIAGNOSIS — N3000 Acute cystitis without hematuria: Secondary | ICD-10-CM | POA: Diagnosis not present

## 2017-03-10 MED ORDER — SULFAMETHOXAZOLE-TRIMETHOPRIM 800-160 MG PO TABS: 1.0000 | ORAL_TABLET | Freq: Two times a day (BID) | ORAL | 0 refills | Status: DC

## 2017-03-10 MED ORDER — SULFAMETHOXAZOLE-TRIMETHOPRIM 800-160 MG PO TABS
1.0000 | ORAL_TABLET | Freq: Two times a day (BID) | ORAL | 0 refills | Status: DC
Start: 2017-03-10 — End: 2017-03-10

## 2017-03-10 NOTE — Progress Notes (Signed)
We are sorry that you are not feeling well.  Here is how we plan to help!  Based on what you shared with me it looks like you most likely have a simple urinary tract infection.  A UTI (Urinary Tract Infection) is a bacterial infection of the bladder.  Most cases of urinary tract infections are simple to treat but a key part of your care is to encourage you to drink plenty of fluids and watch your symptoms carefully.  I have prescribed .  Your sympBactrim to take BID for 14 days.  Bactrim is an antibiotic in the sulfa classtoms should gradually improve. Call us if the burning in your urine worsens, you develop worsening fever, back pain or pelvic pain or if your symptoms do not resolve after completing the antibiotic.  Urinary tract infections can be prevented by drinking plenty of water to keep your body hydrated.  Also be sure when you wipe, wipe from front to back and don't hold it in!  If possible, empty your bladder every 4 hours.  Your e-visit answers were reviewed by a board certified advanced clinical practitioner to complete your personal care plan.  Depending on the condition, your plan could have included both over the counter or prescription medications.  If there is a problem please reply  once you have received a response from your provider.  Your safety is important to [place].  If you have drug allergies check your prescription carefully.    You can use MyChart to ask questions about today's visit, request a non-urgent call back, or ask for a work or school excuse for 24 hours related to this e-Visit. If it has been greater than 24 hours you will need to follow up with your provider, or enter a new e-Visit to address those concerns.   You will get an e-mail in the next two days asking about your experience.  I hope that your e-visit has been valuable and will speed your recovery. Thank you for using e-visits.

## 2017-03-11 MED ORDER — SULFAMETHOXAZOLE-TRIMETHOPRIM 800-160 MG PO TABS: 1.0000 | ORAL_TABLET | Freq: Two times a day (BID) | ORAL | 0 refills | Status: DC

## 2017-03-11 MED FILL — SULFAMETHOXAZOLE/TMP DS TAB: 800-160 | 7 days supply | Qty: 14 | Fill #0

## 2017-03-11 NOTE — Progress Notes (Signed)
Attempted to contact patient to let her know that her bactrim rx did not go electronically last night, that for some reason it printed instead. I resent  Prescription this morning electronically to [facility] as requested. Was unable to reach patient.

## 2017-03-11 NOTE — Addendum Note (Signed)
Addended by: Chevis Pretty on: 03/11/2017 09:23 AM   Modules accepted: Orders

## 2017-05-14 ENCOUNTER — Encounter: Payer: Self-pay | Admitting: Family Medicine

## 2017-06-12 DIAGNOSIS — L918 Other hypertrophic disorders of the skin: Secondary | ICD-10-CM | POA: Diagnosis not present

## 2017-06-12 DIAGNOSIS — D225 Melanocytic nevi of trunk: Secondary | ICD-10-CM | POA: Diagnosis not present

## 2017-06-12 DIAGNOSIS — D2362 Other benign neoplasm of skin of left upper limb, including shoulder: Secondary | ICD-10-CM | POA: Diagnosis not present

## 2017-06-12 DIAGNOSIS — D2261 Melanocytic nevi of right upper limb, including shoulder: Secondary | ICD-10-CM | POA: Diagnosis not present

## 2017-06-12 DIAGNOSIS — L700 Acne vulgaris: Secondary | ICD-10-CM | POA: Diagnosis not present

## 2017-06-12 DIAGNOSIS — L308 Other specified dermatitis: Secondary | ICD-10-CM | POA: Diagnosis not present

## 2017-06-16 ENCOUNTER — Ambulatory Visit (INDEPENDENT_AMBULATORY_CARE_PROVIDER_SITE_OTHER): Payer: 59 | Admitting: Family Medicine

## 2017-06-16 ENCOUNTER — Encounter: Payer: Self-pay | Admitting: Family Medicine

## 2017-06-16 VITALS — BP 130/80 | HR 90 | Temp 98.3°F | Ht 61.5 in | Wt 126.3 lb

## 2017-06-16 DIAGNOSIS — Z23 Encounter for immunization: Secondary | ICD-10-CM | POA: Diagnosis not present

## 2017-06-16 DIAGNOSIS — R251 Tremor, unspecified: Secondary | ICD-10-CM | POA: Diagnosis not present

## 2017-06-16 DIAGNOSIS — Z8632 Personal history of gestational diabetes: Secondary | ICD-10-CM | POA: Diagnosis not present

## 2017-06-16 MED ORDER — ACCU-CHEK FASTCLIX LANCETS MISC: 1.0000 | Freq: Every day | 1 refills | Status: DC

## 2017-06-16 MED ORDER — GLUCOSE BLOOD VI STRP: ORAL_STRIP | 12 refills | Status: DC

## 2017-06-16 NOTE — Patient Instructions (Signed)
BEFORE YOU LEAVE: -glucose monitor -labs -follow up: 1-2 months  Eat regular healthy low sugar meals and low sugar snacks.  Keep log of events and check sugar.  Start getting some gentle regular exercise.  We have ordered labs or studies at this visit. It can take up to 1-2 weeks for results and processing. IF results require follow up or explanation, we will call you with instructions. Clinically stable results will be released to your [facility]. If you have not heard from [place] or cannot find your results in [facility] in 2 weeks please contact our office at [phone].  If you are not yet signed up for [facility], please consider signing up.

## 2017-06-16 NOTE — Progress Notes (Signed)
HPI:  Acute visit for "low blood sugar": -Reports this has been happening as long as she can remember -  Even an early teenage years -Occurs 1-2 times per month -Symptoms include sudden onset of feeling shaky in her hands, racing heart, then anxiety - reports this last about 10 minutes and is resolved by eating food -She has been worried about this for a long time and so she carries snacks in her purse including granola bars so that she could treat the events when they happen, she thinks that his diabetes -She has a past medical history significant for gestational diabetes and anxiety, she admits she has anxiety, but does not feel this is the cause of her symptoms because food resolves symptoms. No symptoms with exercise, chest pain with exercise, shortness of breath with exercise, palpitations at other times.  ROS: See pertinent positives and negatives per HPI.  Past Medical History:  Diagnosis Date  . Anxiety    on zyprexa in the past - stopped medication in 2015  . Blood in stool   . Chicken pox   . Gestational diabetes    gestational diabetes  . Heart murmur    and palpitations, s/p eval with cardiology in 2014  . Urinary tract infection     Past Surgical History:  Procedure Laterality Date  . CESAREAN SECTION     [id number]    Family History  Problem Relation Age of Onset  . Diabetes Paternal Grandfather     Social History   Social History  . Marital status: Married    Spouse name: N/A  . Number of children: N/A  . Years of education: N/A   Social History Main Topics  . Smoking status: Never Smoker  . Smokeless tobacco: Never Used  . Alcohol use Yes     Comment: occasional  . Drug use: No  . Sexual activity: Not Asked   Other Topics Concern  . None   Social History Narrative   Work or School: [employer] Situation: Lives with two children and husband      Spiritual Beliefs: Catholic      Lifestyle: no regular exercise; diet is good               Current Outpatient Prescriptions:  .  ACCU-CHEK FASTCLIX LANCETS MISC, 1 Device by Does not apply route daily., Disp: 102 each, Rfl: 1 .  glucose blood (ACCU-CHEK GUIDE) test strip, Use as instructed, Disp: 100 each, Rfl: 12  EXAM:  Vitals:   06/16/17 1559  BP: 130/80  Pulse: 90  Temp: 98.3 F (36.8 C)  SpO2: 99%    Body mass index is 23.48 kg/m.  GENERAL: vitals reviewed and listed above, alert, oriented, appears well hydrated and in no acute distress  HEENT: atraumatic, conjunttiva clear, no obvious abnormalities on inspection of external nose and ears  NECK: no obvious masses on inspection  LUNGS: clear to auscultation bilaterally, no wheezes, rales or rhonchi, good air movement  CV: HRRR, no peripheral edema  MS: moves all extremities without noticeable abnormality  PSYCH: pleasant and cooperative, no obvious depression or anxiety  ASSESSMENT AND PLAN:  Discussed the following assessment and plan: More than 50% of over 25 minutes spent in total in caring for this patient was spent face-to-face with the patient, counseling and/or coordinating care.    Shaky - Plan: Basic metabolic panel, CBC, TSH  History of gestational diabetes - Plan: Hemoglobin A1c  Need for immunization against influenza -  Plan: Flu Vaccine QUAD 6+ mos PF IM (Fluarix Quad PF)  -we'll check some basic labs including a thyroid panel and hemoglobin A1c, though this has been negative in the past -We discussed that there is a risk of diabetes that is higher than the average population for those with gestational diabetes and I did advise a healthy low sugar diet and regular meals as she feels the symptoms occur more frequently when she skips meals -Also we gave her a blood sugar monitor so that she can check her sugar during these events -her symptoms sound like they could be possible panic attacks and we did discuss his potential diagnosis and I did advise that she try some cognitive  behavioral therapy if this blood sugar is normal during these events -Follow-up in 1-2 months -She also opted to go ahead and get her flu shots today -Patient advised to return or notify a doctor immediately if symptoms worsen or persist or new concerns arise.  Patient Instructions  BEFORE YOU LEAVE: -glucose monitor -labs -follow up: 1-2 months  Eat regular healthy low sugar meals and low sugar snacks.  Keep log of events and check sugar.  Start getting some gentle regular exercise.  We have ordered labs or studies at this visit. It can take up to 1-2 weeks for results and processing. IF results require follow up or explanation, we will call you with instructions. Clinically stable results will be released to your [facility]. If you have not heard from [place] or cannot find your results in [facility] in 2 weeks please contact our office at [phone].  If you are not yet signed up for [facility], please consider signing up.          Colin Benton R., DO

## 2017-06-17 LAB — BASIC METABOLIC PANEL
BUN: 10 mg/dL (ref 6–23)
CALCIUM: 9.6 mg/dL (ref 8.4–10.5)
CHLORIDE: 103 meq/L (ref 96–112)
CO2: 25 mEq/L (ref 19–32)
CREATININE: 0.64 mg/dL (ref 0.40–1.20)
GFR: 110.6 mL/min (ref >60.00)
GLUCOSE: 93 mg/dL (ref 70–99)
Potassium: 3.8 mEq/L (ref 3.5–5.1)
Sodium: 138 mEq/L (ref 135–145)

## 2017-06-17 LAB — CBC
HEMATOCRIT: 38.1 % (ref 36.0–46.0)
Hemoglobin: 12.9 g/dL (ref 12.0–15.0)
MCHC: 33.9 g/dL (ref 30.0–36.0)
MCV: 88.5 fl (ref 78.0–100.0)
PLATELETS: 276 10*3/uL (ref 150.0–400.0)
RBC: 4.3 Mil/uL (ref 3.87–5.11)
RDW: 12.5 % (ref 11.5–15.5)
WBC: 6.8 10*3/uL (ref 4.0–10.5)

## 2017-06-17 LAB — TSH: TSH: 2.35 u[IU]/mL (ref 0.35–4.50)

## 2017-06-17 LAB — HEMOGLOBIN A1C: HEMOGLOBIN A1C: 5.3 % (ref 4.6–6.5)

## 2017-06-18 ENCOUNTER — Telehealth: Payer: Self-pay | Admitting: *Deleted

## 2017-06-18 NOTE — Telephone Encounter (Signed)
Patient called and stated she received the Mychart message about her labs and wanted to make sure everything was OK.  I advised the pt per Dr Julianne Rice note the labs look good.  I advised her she should follow up in 1-2 months for the same problem she was seen for yesterday and keep the physical appt in November as often times the insurance may not cover a problem/sick visit at the same time as a physical and she agreed.

## 2017-06-19 ENCOUNTER — Other Ambulatory Visit: Payer: Self-pay | Admitting: *Deleted

## 2017-06-19 MED ORDER — GLUCOSE BLOOD VI STRP: ORAL_STRIP | 1 refills | Status: DC

## 2017-06-19 MED ORDER — FREESTYLE LANCETS MISC: 1 refills | Status: DC

## 2017-06-19 MED ORDER — FREESTYLE LITE DEVI: 0 refills | Status: DC

## 2017-06-19 MED FILL — FREESTYLE LITE METER: 1 days supply | Qty: 1 | Fill #0

## 2017-06-19 MED FILL — FREESTYLE LANCETS: 90 days supply | Qty: 100 | Fill #0

## 2017-06-19 MED FILL — FREESTYLE LITE TEST STRIP: 90 days supply | Qty: 100 | Fill #0

## 2017-06-19 NOTE — Telephone Encounter (Signed)
[facility] faxed a note stating the Accu-chek is not covered by the pts insurance and Freestyle Lite glucometer and supplies are an alternative.  Rx was sent to the pharmacy.

## 2017-07-07 NOTE — Progress Notes (Signed)
HPI:  Here for CPE:  -Concerns and/or follow up today: none Occ hemorrhoid flares. Reports several evaluations in the past including what sounds like sigmoid and dx hemorrhoids.  occ pain and itching - resolves with prep H. No constipation, melena, hematochezia.  Physical dermatologist last week for skin exam.  Eating more regular meals now spells that she thought were hypoglycemia.  -Diet: variety of foods, balance and well rounded, larger portion sizes -Exercise: no regular exercise -Taking folic acid, vitamin D or calcium: no -Diabetes and Dyslipidemia Screening: Cholesterol done last year, normal, diabetes screening and other basic labs recently done and okay -Vaccines: see vaccine section EPIC -pap history: Sees gynecologist -FDLMP: see nursing notes -sexual activity: yes, female partner, no new partners -wants STI testing (Hep C if born [birth year]): no -FH breast, colon or ovarian ca: see FH Last mammogram: Not applicable Last colon cancer screening: Not applicable Breast Ca Risk Assessment: see family history and pt history DEXA (>/= 65): n/a  -Alcohol, Tobacco, drug use: see social history  Review of Systems - no fevers, unintentional weight loss, vision loss, hearing loss, chest pain, sob, hemoptysis, melena, hematochezia, hematuria, genital discharge, changing or concerning skin lesions, bleeding, bruising, loc, thoughts of self harm or SI  Past Medical History:  Diagnosis Date  . Anxiety    on zyprexa in the past - stopped medication in 2015  . Blood in stool   . Chicken pox   . Gestational diabetes    gestational diabetes  . Heart murmur    and palpitations, s/p eval with cardiology in 2014  . Urinary tract infection     Past Surgical History:  Procedure Laterality Date  . CESAREAN SECTION     [id number]    Family History  Problem Relation Age of Onset  . Diabetes Paternal Grandfather     Social History   Socioeconomic History  . Marital status:  Married    Spouse name: None  . Number of children: None  . Years of education: None  . Highest education level: None  Social Needs  . Financial resource strain: None  . Food insecurity - worry: None  . Food insecurity - inability: None  . Transportation needs - medical: None  . Transportation needs - non-medical: None  Occupational History  . None  Tobacco Use  . Smoking status: Never Smoker  . Smokeless tobacco: Never Used  Substance and Sexual Activity  . Alcohol use: Yes    Comment: occasional  . Drug use: No  . Sexual activity: None  Other Topics Concern  . None  Social History Narrative   Work or School: [employer] Situation: Lives with two children and husband      Spiritual Beliefs: Catholic      Lifestyle: no regular exercise; diet is good           Current Outpatient Medications:  .  Blood Glucose Monitoring Suppl (FREESTYLE LITE) DEVI, Use as directed to check blood sugar once a day, Disp: 1 each, Rfl: 0 .  glucose blood (FREESTYLE LITE) test strip, Use as instructed once a day, Disp: 100 each, Rfl: 1 .  Lancets (FREESTYLE) lancets, Use as instructed to check blood sugar, Disp: 100 each, Rfl: 1  EXAM:  Vitals:   07/09/17 0726  BP: 98/62  Pulse: 84  Temp: 97.9 F (36.6 C)    GENERAL: vitals reviewed and listed below, alert, oriented, appears well hydrated and in no acute distress  HEENT:  head atraumatic, PERRLA, normal appearance of eyes, ears, nose and mouth. moist mucus membranes.  NECK: supple, no masses or lymphadenopathy  LUNGS: clear to auscultation bilaterally, no rales, rhonchi or wheeze  CV: HRRR, +murmur, no peripheral edema or cyanosis, normal pedal pulses  ABDOMEN: bowel sounds normal, soft, non tender to palpation, no masses, no rebound or guarding  RECTUM: hemorrhoids  SKIN: no rash or abnormal lesions  MS: normal gait, moves all extremities normally  NEURO: normal gait, speech and thought processing grossly  intact, muscle tone grossly intact throughout  PSYCH: normal affect, pleasant and cooperative  ASSESSMENT AND PLAN:  Discussed the following assessment and plan:  PREVENTIVE EXAM: -Discussed and advised all [place] preventive services health task force level A and B recommendations for age, sex and risks. -Advised at least 150 minutes of exercise per week and a healthy diet with avoidance of (less then 1 serving per week) processed foods, white starches, red meat, fast foods and sweets and consisting of: * 5-9 servings of fresh fruits and vegetables (not corn or potatoes) *nuts and seeds, beans *olives and olive oil *lean meats such as fish and white chicken  *whole grains -labs, studies and vaccines per orders this encounter  Encounter for preventive health examination  Screening for depression  History of hemorrhoids -discussed management options, OTC options -she will call if decides to see GI  Patient advised to return to clinic immediately if symptoms worsen or persist or new concerns.  Patient Instructions  Follow up yearly for Physical.  Vitamin D3 802,000 international units daily.  Health Maintenance, Female Adopting a healthy lifestyle and getting preventive care can go a long way to promote health and wellness. Talk with your health care provider about what schedule of regular examinations is right for you. This is a good chance for you to check in with your provider about disease prevention and staying healthy. In between checkups, there are plenty of things you can do on your own. Experts have done a lot of research about which lifestyle changes and preventive measures are most likely to keep you healthy. Ask your health care provider for more information. Weight and diet Eat a healthy diet  Be sure to include plenty of vegetables, fruits, low-fat dairy products, and lean protein.  Do not eat a lot of foods high in solid fats, added sugars, or salt.  Get regular  exercise. This is one of the most important things you can do for your health. ? Most adults should exercise for at least 150 minutes each week. The exercise should increase your heart rate and make you sweat (moderate-intensity exercise). ? Most adults should also do strengthening exercises at least twice a week. This is in addition to the moderate-intensity exercise.  Maintain a healthy weight  Body mass index (BMI) is a measurement that can be used to identify possible weight problems. It estimates body fat based on height and weight. Your health care provider can help determine your BMI and help you achieve or maintain a healthy weight.  For females 1 years of age and older: ? A BMI below 18.5 is considered underweight. ? A BMI of 18.5 to 24.9 is normal. ? A BMI of 25 to 29.9 is considered overweight. ? A BMI of 30 and above is considered obese.  Watch levels of cholesterol and blood lipids  You should start having your blood tested for lipids and cholesterol at 37 years of age, then have this test every 5 years.  You  may need to have your cholesterol levels checked more often if: ? Your lipid or cholesterol levels are high. ? You are older than 37 years of age. ? You are at high risk for heart disease.  Cancer screening Lung Cancer  Lung cancer screening is recommended for adults 32-70 years old who are at high risk for lung cancer because of a history of smoking.  A yearly low-dose CT scan of the lungs is recommended for people who: ? Currently smoke. ? Have quit within the past 15 years. ? Have at least a 30-pack-year history of smoking. A pack year is smoking an average of one pack of cigarettes a day for 1 year.  Yearly screening should continue until it has been 15 years since you quit.  Yearly screening should stop if you develop a health problem that would prevent you from having lung cancer treatment.  Breast Cancer  Practice breast self-awareness. This means  understanding how your breasts normally appear and feel.  It also means doing regular breast self-exams. Let your health care provider know about any changes, no matter how small.  If you are in your 20s or 30s, you should have a clinical breast exam (CBE) by a health care provider every 1-3 years as part of a regular health exam.  If you are 10 or older, have a CBE every year. Also consider having a breast X-ray (mammogram) every year.  If you have a family history of breast cancer, talk to your health care provider about genetic screening.  If you are at high risk for breast cancer, talk to your health care provider about having an MRI and a mammogram every year.  Breast cancer gene (BRCA) assessment is recommended for women who have family members with BRCA-related cancers. BRCA-related cancers include: ? Breast. ? Ovarian. ? Tubal. ? Peritoneal cancers.  Results of the assessment will determine the need for genetic counseling and BRCA1 and BRCA2 testing.  Cervical Cancer Your health care provider may recommend that you be screened regularly for cancer of the pelvic organs (ovaries, uterus, and vagina). This screening involves a pelvic examination, including checking for microscopic changes to the surface of your cervix (Pap test). You may be encouraged to have this screening done every 3 years, beginning at age 23.  For women ages 46-65, health care providers may recommend pelvic exams and Pap testing every 3 years, or they may recommend the Pap and pelvic exam, combined with testing for human papilloma virus (HPV), every 5 years. Some types of HPV increase your risk of cervical cancer. Testing for HPV may also be done on women of any age with unclear Pap test results.  Other health care providers may not recommend any screening for nonpregnant women who are considered low risk for pelvic cancer and who do not have symptoms. Ask your health care provider if a screening pelvic exam is  right for you.  If you have had past treatment for cervical cancer or a condition that could lead to cancer, you need Pap tests and screening for cancer for at least 20 years after your treatment. If Pap tests have been discontinued, your risk factors (such as having a new sexual partner) need to be reassessed to determine if screening should resume. Some women have medical problems that increase the chance of getting cervical cancer. In these cases, your health care provider may recommend more frequent screening and Pap tests.  Colorectal Cancer  This type of cancer can be detected and  often prevented.  Routine colorectal cancer screening usually begins at 37 years of age and continues through 37 years of age.  Your health care provider may recommend screening at an earlier age if you have risk factors for colon cancer.  Your health care provider may also recommend using home test kits to check for hidden blood in the stool.  A small camera at the end of a tube can be used to examine your colon directly (sigmoidoscopy or colonoscopy). This is done to check for the earliest forms of colorectal cancer.  Routine screening usually begins at age 21.  Direct examination of the colon should be repeated every 5-10 years through 37 years of age. However, you may need to be screened more often if early forms of precancerous polyps or small growths are found.  Skin Cancer  Check your skin from head to toe regularly.  Tell your health care provider about any new moles or changes in moles, especially if there is a change in a mole's shape or color.  Also tell your health care provider if you have a mole that is larger than the size of a pencil eraser.  Always use sunscreen. Apply sunscreen liberally and repeatedly throughout the day.  Protect yourself by wearing long sleeves, pants, a wide-brimmed hat, and sunglasses whenever you are outside.  Heart disease, diabetes, and high blood  pressure  High blood pressure causes heart disease and increases the risk of stroke. High blood pressure is more likely to develop in: ? People who have blood pressure in the high end of the normal range (130-139/85-89 mm Hg). ? People who are overweight or obese. ? People who are African American.  If you are 30-55 years of age, have your blood pressure checked every 3-5 years. If you are 25 years of age or older, have your blood pressure checked every year. You should have your blood pressure measured twice-once when you are at a hospital or clinic, and once when you are not at a hospital or clinic. Record the average of the two measurements. To check your blood pressure when you are not at a hospital or clinic, you can use: ? An automated blood pressure machine at a pharmacy. ? A home blood pressure monitor.  If you are between 32 years and 68 years old, ask your health care provider if you should take aspirin to prevent strokes.  Have regular diabetes screenings. This involves taking a blood sample to check your fasting blood sugar level. ? If you are at a normal weight and have a low risk for diabetes, have this test once every three years after 37 years of age. ? If you are overweight and have a high risk for diabetes, consider being tested at a younger age or more often. Preventing infection Hepatitis B  If you have a higher risk for hepatitis B, you should be screened for this virus. You are considered at high risk for hepatitis B if: ? You were born in a country where hepatitis B is common. Ask your health care provider which countries are considered high risk. ? Your parents were born in a high-risk country, and you have not been immunized against hepatitis B (hepatitis B vaccine). ? You have HIV or AIDS. ? You use needles to inject street drugs. ? You live with someone who has hepatitis B. ? You have had sex with someone who has hepatitis B. ? You get hemodialysis  treatment. ? You take certain medicines for conditions,  including cancer, organ transplantation, and autoimmune conditions.  Hepatitis C  Blood testing is recommended for: ? Everyone born from [birth year] through 1966. ? Anyone with known risk factors for hepatitis C.  Sexually transmitted infections (STIs)  You should be screened for sexually transmitted infections (STIs) including gonorrhea and chlamydia if: ? You are sexually active and are younger than 37 years of age. ? You are older than 37 years of age and your health care provider tells you that you are at risk for this type of infection. ? Your sexual activity has changed since you were last screened and you are at an increased risk for chlamydia or gonorrhea. Ask your health care provider if you are at risk.  If you do not have HIV, but are at risk, it may be recommended that you take a prescription medicine daily to prevent HIV infection. This is called pre-exposure prophylaxis (PrEP). You are considered at risk if: ? You are sexually active and do not regularly use condoms or know the HIV status of your partner(s). ? You take drugs by injection. ? You are sexually active with a partner who has HIV.  Talk with your health care provider about whether you are at high risk of being infected with HIV. If you choose to begin PrEP, you should first be tested for HIV. You should then be tested every 3 months for as long as you are taking PrEP. Pregnancy  If you are premenopausal and you may become pregnant, ask your health care provider about preconception counseling.  If you may become pregnant, take 400 to 800 micrograms (mcg) of folic acid every day.  If you want to prevent pregnancy, talk to your health care provider about birth control (contraception). Osteoporosis and menopause  Osteoporosis is a disease in which the bones lose minerals and strength with aging. This can result in serious bone fractures. Your risk for osteoporosis  can be identified using a bone density scan.  If you are 76 years of age or older, or if you are at risk for osteoporosis and fractures, ask your health care provider if you should be screened.  Ask your health care provider whether you should take a calcium or vitamin D supplement to lower your risk for osteoporosis.  Menopause may have certain physical symptoms and risks.  Hormone replacement therapy may reduce some of these symptoms and risks. Talk to your health care provider about whether hormone replacement therapy is right for you. Follow these instructions at home:  Schedule regular health, dental, and eye exams.  Stay current with your immunizations.  Do not use any tobacco products including cigarettes, chewing tobacco, or electronic cigarettes.  If you are pregnant, do not drink alcohol.  If you are breastfeeding, limit how much and how often you drink alcohol.  Limit alcohol intake to no more than 1 drink per day for nonpregnant women. One drink equals 12 ounces of beer, 5 ounces of wine, or 1 ounces of hard liquor.  Do not use street drugs.  Do not share needles.  Ask your health care provider for help if you need support or information about quitting drugs.  Tell your health care provider if you often feel depressed.  Tell your health care provider if you have ever been abused or do not feel safe at home. This information is not intended to replace advice given to you by your health care provider. Make sure you discuss any questions you have with your health care provider. Document  Released: 02/24/2011 Document Revised: 01/17/2016 Document Reviewed: 05/15/2015 Elsevier Interactive Patient Education  2018 [employer].   Hemorrhoids Hemorrhoids are swollen veins in and around the rectum or anus. Hemorrhoids can cause pain, itching, or bleeding. Most of the time, they do not cause serious problems. They usually get better with diet changes, lifestyle changes, and  other home treatments. Follow these instructions at home: Eating and drinking  Eat foods that have fiber, such as whole grains, beans, nuts, fruits, and vegetables. Ask your doctor about taking products that have added fiber (fibersupplements).  Drink enough fluid to keep your pee (urine) clear or pale yellow. For Pain and Swelling  Take a warm-water bath (sitz bath) for 20 minutes to ease pain. Do this 3-4 times a day.  If directed, put ice on the painful area. It may be helpful to use ice between your warm baths. ? Put ice in a plastic bag. ? Place a towel between your skin and the bag. ? Leave the ice on for 20 minutes, 2-3 times a day. General instructions  Take over-the-counter and prescription medicines only as told by your doctor. ? Medicated creams and medicines that are inserted into the anus (suppositories) may be used or applied as told.  Exercise often.  Go to the bathroom when you have the urge to poop (to have a bowel movement). Do not wait.  Avoid pushing too hard (straining) when you poop.  Keep the butt area dry and clean. Use wet toilet paper or moist paper towels.  Do not sit on the toilet for a long time. Contact a doctor if:  You have any of these: ? Pain and swelling that do not get better with treatment or medicine. ? Bleeding that will not stop. ? Trouble pooping or you cannot poop. ? Pain or swelling outside the area of the hemorrhoids. This information is not intended to replace advice given to you by your health care provider. Make sure you discuss any questions you have with your health care provider. Document Released: 05/20/2008 Document Revised: 01/17/2016 Document Reviewed: 04/25/2015 Elsevier Interactive Patient Education  2018 [employer].     No Follow-up on file.  Colin Benton R., DO

## 2017-07-09 ENCOUNTER — Encounter: Payer: Self-pay | Admitting: Family Medicine

## 2017-07-09 ENCOUNTER — Ambulatory Visit (INDEPENDENT_AMBULATORY_CARE_PROVIDER_SITE_OTHER): Payer: 59 | Admitting: Family Medicine

## 2017-07-09 VITALS — BP 98/62 | HR 84 | Temp 97.9°F | Ht 62.0 in | Wt 130.9 lb

## 2017-07-09 DIAGNOSIS — Z0000 Encounter for general adult medical examination without abnormal findings: Secondary | ICD-10-CM

## 2017-07-09 DIAGNOSIS — Z8719 Personal history of other diseases of the digestive system: Secondary | ICD-10-CM

## 2017-07-09 DIAGNOSIS — Z1331 Encounter for screening for depression: Secondary | ICD-10-CM

## 2017-07-09 NOTE — Patient Instructions (Addendum)
Follow up yearly for Physical.  Vitamin D3 802,000 international units daily.  Health Maintenance, Female Adopting a healthy lifestyle and getting preventive care can go a long way to promote health and wellness. Talk with your health care provider about what schedule of regular examinations is right for you. This is a good chance for you to check in with your provider about disease prevention and staying healthy. In between checkups, there are plenty of things you can do on your own. Experts have done a lot of research about which lifestyle changes and preventive measures are most likely to keep you healthy. Ask your health care provider for more information. Weight and diet Eat a healthy diet  Be sure to include plenty of vegetables, fruits, low-fat dairy products, and lean protein.  Do not eat a lot of foods high in solid fats, added sugars, or salt.  Get regular exercise. This is one of the most important things you can do for your health. ? Most adults should exercise for at least 150 minutes each week. The exercise should increase your heart rate and make you sweat (moderate-intensity exercise). ? Most adults should also do strengthening exercises at least twice a week. This is in addition to the moderate-intensity exercise.  Maintain a healthy weight  Body mass index (BMI) is a measurement that can be used to identify possible weight problems. It estimates body fat based on height and weight. Your health care provider can help determine your BMI and help you achieve or maintain a healthy weight.  For females 81 years of age and older: ? A BMI below 18.5 is considered underweight. ? A BMI of 18.5 to 24.9 is normal. ? A BMI of 25 to 29.9 is considered overweight. ? A BMI of 30 and above is considered obese.  Watch levels of cholesterol and blood lipids  You should start having your blood tested for lipids and cholesterol at 37 years of age, then have this test every 5 years.  You  may need to have your cholesterol levels checked more often if: ? Your lipid or cholesterol levels are high. ? You are older than 37 years of age. ? You are at high risk for heart disease.  Cancer screening Lung Cancer  Lung cancer screening is recommended for adults 20-69 years old who are at high risk for lung cancer because of a history of smoking.  A yearly low-dose CT scan of the lungs is recommended for people who: ? Currently smoke. ? Have quit within the past 15 years. ? Have at least a 30-pack-year history of smoking. A pack year is smoking an average of one pack of cigarettes a day for 1 year.  Yearly screening should continue until it has been 15 years since you quit.  Yearly screening should stop if you develop a health problem that would prevent you from having lung cancer treatment.  Breast Cancer  Practice breast self-awareness. This means understanding how your breasts normally appear and feel.  It also means doing regular breast self-exams. Let your health care provider know about any changes, no matter how small.  If you are in your 20s or 30s, you should have a clinical breast exam (CBE) by a health care provider every 1-3 years as part of a regular health exam.  If you are 84 or older, have a CBE every year. Also consider having a breast X-ray (mammogram) every year.  If you have a family history of breast cancer, talk to your  health care provider about genetic screening.  If you are at high risk for breast cancer, talk to your health care provider about having an MRI and a mammogram every year.  Breast cancer gene (BRCA) assessment is recommended for women who have family members with BRCA-related cancers. BRCA-related cancers include: ? Breast. ? Ovarian. ? Tubal. ? Peritoneal cancers.  Results of the assessment will determine the need for genetic counseling and BRCA1 and BRCA2 testing.  Cervical Cancer Your health care provider may recommend that you  be screened regularly for cancer of the pelvic organs (ovaries, uterus, and vagina). This screening involves a pelvic examination, including checking for microscopic changes to the surface of your cervix (Pap test). You may be encouraged to have this screening done every 3 years, beginning at age 31.  For women ages 37-65, health care providers may recommend pelvic exams and Pap testing every 3 years, or they may recommend the Pap and pelvic exam, combined with testing for human papilloma virus (HPV), every 5 years. Some types of HPV increase your risk of cervical cancer. Testing for HPV may also be done on women of any age with unclear Pap test results.  Other health care providers may not recommend any screening for nonpregnant women who are considered low risk for pelvic cancer and who do not have symptoms. Ask your health care provider if a screening pelvic exam is right for you.  If you have had past treatment for cervical cancer or a condition that could lead to cancer, you need Pap tests and screening for cancer for at least 20 years after your treatment. If Pap tests have been discontinued, your risk factors (such as having a new sexual partner) need to be reassessed to determine if screening should resume. Some women have medical problems that increase the chance of getting cervical cancer. In these cases, your health care provider may recommend more frequent screening and Pap tests.  Colorectal Cancer  This type of cancer can be detected and often prevented.  Routine colorectal cancer screening usually begins at 37 years of age and continues through 37 years of age.  Your health care provider may recommend screening at an earlier age if you have risk factors for colon cancer.  Your health care provider may also recommend using home test kits to check for hidden blood in the stool.  A small camera at the end of a tube can be used to examine your colon directly (sigmoidoscopy or  colonoscopy). This is done to check for the earliest forms of colorectal cancer.  Routine screening usually begins at age 53.  Direct examination of the colon should be repeated every 5-10 years through 37 years of age. However, you may need to be screened more often if early forms of precancerous polyps or small growths are found.  Skin Cancer  Check your skin from head to toe regularly.  Tell your health care provider about any new moles or changes in moles, especially if there is a change in a mole's shape or color.  Also tell your health care provider if you have a mole that is larger than the size of a pencil eraser.  Always use sunscreen. Apply sunscreen liberally and repeatedly throughout the day.  Protect yourself by wearing long sleeves, pants, a wide-brimmed hat, and sunglasses whenever you are outside.  Heart disease, diabetes, and high blood pressure  High blood pressure causes heart disease and increases the risk of stroke. High blood pressure is more likely to  develop in: ? People who have blood pressure in the high end of the normal range (130-139/85-89 mm Hg). ? People who are overweight or obese. ? People who are African American.  If you are 26-23 years of age, have your blood pressure checked every 3-5 years. If you are 62 years of age or older, have your blood pressure checked every year. You should have your blood pressure measured twice-once when you are at a hospital or clinic, and once when you are not at a hospital or clinic. Record the average of the two measurements. To check your blood pressure when you are not at a hospital or clinic, you can use: ? An automated blood pressure machine at a pharmacy. ? A home blood pressure monitor.  If you are between 47 years and 84 years old, ask your health care provider if you should take aspirin to prevent strokes.  Have regular diabetes screenings. This involves taking a blood sample to check your fasting blood sugar  level. ? If you are at a normal weight and have a low risk for diabetes, have this test once every three years after 37 years of age. ? If you are overweight and have a high risk for diabetes, consider being tested at a younger age or more often. Preventing infection Hepatitis B  If you have a higher risk for hepatitis B, you should be screened for this virus. You are considered at high risk for hepatitis B if: ? You were born in a country where hepatitis B is common. Ask your health care provider which countries are considered high risk. ? Your parents were born in a high-risk country, and you have not been immunized against hepatitis B (hepatitis B vaccine). ? You have HIV or AIDS. ? You use needles to inject street drugs. ? You live with someone who has hepatitis B. ? You have had sex with someone who has hepatitis B. ? You get hemodialysis treatment. ? You take certain medicines for conditions, including cancer, organ transplantation, and autoimmune conditions.  Hepatitis C  Blood testing is recommended for: ? Everyone born from [birth year] through 1966. ? Anyone with known risk factors for hepatitis C.  Sexually transmitted infections (STIs)  You should be screened for sexually transmitted infections (STIs) including gonorrhea and chlamydia if: ? You are sexually active and are younger than 37 years of age. ? You are older than 37 years of age and your health care provider tells you that you are at risk for this type of infection. ? Your sexual activity has changed since you were last screened and you are at an increased risk for chlamydia or gonorrhea. Ask your health care provider if you are at risk.  If you do not have HIV, but are at risk, it may be recommended that you take a prescription medicine daily to prevent HIV infection. This is called pre-exposure prophylaxis (PrEP). You are considered at risk if: ? You are sexually active and do not regularly use condoms or know the HIV  status of your partner(s). ? You take drugs by injection. ? You are sexually active with a partner who has HIV.  Talk with your health care provider about whether you are at high risk of being infected with HIV. If you choose to begin PrEP, you should first be tested for HIV. You should then be tested every 3 months for as long as you are taking PrEP. Pregnancy  If you are premenopausal and you may become pregnant,  ask your health care provider about preconception counseling.  If you may become pregnant, take 400 to 800 micrograms (mcg) of folic acid every day.  If you want to prevent pregnancy, talk to your health care provider about birth control (contraception). Osteoporosis and menopause  Osteoporosis is a disease in which the bones lose minerals and strength with aging. This can result in serious bone fractures. Your risk for osteoporosis can be identified using a bone density scan.  If you are 67 years of age or older, or if you are at risk for osteoporosis and fractures, ask your health care provider if you should be screened.  Ask your health care provider whether you should take a calcium or vitamin D supplement to lower your risk for osteoporosis.  Menopause may have certain physical symptoms and risks.  Hormone replacement therapy may reduce some of these symptoms and risks. Talk to your health care provider about whether hormone replacement therapy is right for you. Follow these instructions at home:  Schedule regular health, dental, and eye exams.  Stay current with your immunizations.  Do not use any tobacco products including cigarettes, chewing tobacco, or electronic cigarettes.  If you are pregnant, do not drink alcohol.  If you are breastfeeding, limit how much and how often you drink alcohol.  Limit alcohol intake to no more than 1 drink per day for nonpregnant women. One drink equals 12 ounces of beer, 5 ounces of wine, or 1 ounces of hard liquor.  Do not  use street drugs.  Do not share needles.  Ask your health care provider for help if you need support or information about quitting drugs.  Tell your health care provider if you often feel depressed.  Tell your health care provider if you have ever been abused or do not feel safe at home. This information is not intended to replace advice given to you by your health care provider. Make sure you discuss any questions you have with your health care provider. Document Released: 02/24/2011 Document Revised: 01/17/2016 Document Reviewed: 05/15/2015 Elsevier Interactive Patient Education  2018 [employer].   Hemorrhoids Hemorrhoids are swollen veins in and around the rectum or anus. Hemorrhoids can cause pain, itching, or bleeding. Most of the time, they do not cause serious problems. They usually get better with diet changes, lifestyle changes, and other home treatments. Follow these instructions at home: Eating and drinking  Eat foods that have fiber, such as whole grains, beans, nuts, fruits, and vegetables. Ask your doctor about taking products that have added fiber (fibersupplements).  Drink enough fluid to keep your pee (urine) clear or pale yellow. For Pain and Swelling  Take a warm-water bath (sitz bath) for 20 minutes to ease pain. Do this 3-4 times a day.  If directed, put ice on the painful area. It may be helpful to use ice between your warm baths. ? Put ice in a plastic bag. ? Place a towel between your skin and the bag. ? Leave the ice on for 20 minutes, 2-3 times a day. General instructions  Take over-the-counter and prescription medicines only as told by your doctor. ? Medicated creams and medicines that are inserted into the anus (suppositories) may be used or applied as told.  Exercise often.  Go to the bathroom when you have the urge to poop (to have a bowel movement). Do not wait.  Avoid pushing too hard (straining) when you poop.  Keep the butt area dry and  clean. Use wet toilet  paper or moist paper towels.  Do not sit on the toilet for a long time. Contact a doctor if:  You have any of these: ? Pain and swelling that do not get better with treatment or medicine. ? Bleeding that will not stop. ? Trouble pooping or you cannot poop. ? Pain or swelling outside the area of the hemorrhoids. This information is not intended to replace advice given to you by your health care provider. Make sure you discuss any questions you have with your health care provider. Document Released: 05/20/2008 Document Revised: 01/17/2016 Document Reviewed: 04/25/2015 Elsevier Interactive Patient Education  [employer].

## 2017-08-28 ENCOUNTER — Telehealth: Payer: Self-pay | Admitting: Family Medicine

## 2017-08-28 DIAGNOSIS — E162 Hypoglycemia, unspecified: Secondary | ICD-10-CM

## 2017-08-28 NOTE — Telephone Encounter (Signed)
Ok to send referral. Let patient know referral sent. The doctor will have all the notes and labs in [place].

## 2017-08-28 NOTE — Telephone Encounter (Signed)
Copied from [place] [id number]. Topic: Referral - Request >> Aug 28, 2017  9:10 AM Cl[place], Laban Emperor wrote: Reason for CRM: Pt would like a referral to [facility] for her blood sugar. She would like the OV notes from [place] 06/16/17 to be sent along with the referral.  [address] [place], [place], [place] [address] [phone] Fax number [phone] Attn: Omega >> Aug 28, 2017 10:44 AM Morey Hummingbird wrote: Routed to wrong dept, please route accordingly

## 2017-08-31 NOTE — Telephone Encounter (Signed)
I left a detailed message at the pts cell number the referral was placed and the physician can view office notes and labs in [place].

## 2017-09-03 ENCOUNTER — Telehealth: Payer: Self-pay | Admitting: Family Medicine

## 2017-09-03 ENCOUNTER — Encounter: Payer: Self-pay | Admitting: Family Medicine

## 2017-09-03 NOTE — Telephone Encounter (Signed)
Also faxed 10/23 office note to [phone] attn: Omega.

## 2017-09-03 NOTE — Telephone Encounter (Signed)
Demographics and labs from 10/23 was faxed to [phone].

## 2017-09-03 NOTE — Telephone Encounter (Signed)
Copied from [place]. Topic: General - Other >> Sep 03, 2017 12:22 PM Cecelia Byars, NT wrote: Reason for CRM: Dr Rosina Lowenstein office called and said they are also needing the demographic sheet, and also the last labs faxed to [phone]

## 2017-09-03 NOTE — Telephone Encounter (Signed)
Copied from [place] [phone]. Topic: Referral - Request >> Sep 03, 2017  8:14 AM [name] wrote: Reason for CRM: Patient has requested a referral on 08/28/2016. It appears that the first request was sent to [facility] in error. The original CRM read as follows: Pt would like a referral to [facility] for her blood sugar. She would like the OV notes from [place] 06/16/17 to be sent along with the referral.  [place], [place], [place] [address] [phone] Fax number [phone] Attn: Omega  Patient also wants [name] to contact her this morning. I advised the patient that I will request that [place] call her, but cannot guarantee the exact time.       Thank You!!!

## 2017-09-21 ENCOUNTER — Encounter: Payer: Self-pay | Admitting: Family Medicine

## 2017-09-21 ENCOUNTER — Ambulatory Visit: Payer: Self-pay | Admitting: *Deleted

## 2017-09-21 ENCOUNTER — Ambulatory Visit (INDEPENDENT_AMBULATORY_CARE_PROVIDER_SITE_OTHER): Payer: No Typology Code available for payment source | Admitting: Family Medicine

## 2017-09-21 VITALS — BP 120/70 | HR 82 | Temp 98.4°F | Ht 62.0 in | Wt 132.7 lb

## 2017-09-21 DIAGNOSIS — R011 Cardiac murmur, unspecified: Secondary | ICD-10-CM | POA: Diagnosis not present

## 2017-09-21 DIAGNOSIS — R42 Dizziness and giddiness: Secondary | ICD-10-CM | POA: Diagnosis not present

## 2017-09-21 DIAGNOSIS — R002 Palpitations: Secondary | ICD-10-CM

## 2017-09-21 MED ORDER — MECLIZINE HCL 12.5 MG PO TABS: 12.5000 mg | ORAL_TABLET | Freq: Three times a day (TID) | ORAL | 0 refills | Status: DC | PRN

## 2017-09-21 NOTE — Patient Instructions (Signed)
-We placed a referral for you as discussed to the cardiologist per your request and to the physical therapist for vestibular rehabilitation for the vertigo. It usually takes about 1-2 weeks to process and schedule this referral. If you have not heard from [place] regarding this appointment in 2 weeks please contact our office.  -Meclizine to the pharmacy for the vertigo as well.  Please do not drive with vertigo.  -Flonase 2 sprays each nostril daily for 2 weeks for the fluid behind the eardrum.  -Please let us know if the vertigo does not resolve with the treatment.   Palpitations A palpitation is the feeling that your heart:  Has an uneven (irregular) heartbeat.  Is beating faster than normal.  Is fluttering.  Is skipping a beat.  This is usually not a serious problem. In some cases, you may need more medical tests. Follow these instructions at home:  Avoid: ? Caffeine in coffee, tea, soft drinks, diet pills, and energy drinks. ? Chocolate. ? Alcohol.  Do not use any tobacco products. These include cigarettes, chewing tobacco, and e-cigarettes. If you need help quitting, ask your doctor.  Try to reduce your stress. These things may help: ? Yoga. ? Meditation. ? Physical activity. Swimming, jogging, and walking are good choices. ? A method that helps you use your mind to control things in your body, like heartbeats (biofeedback).  Get plenty of rest and sleep.  Take over-the-counter and prescription medicines only as told by your doctor.  Keep all follow-up visits as told by your doctor. This is important. Contact a doctor if:  Your heartbeat is still fast or uneven after 24 hours.  Your palpitations occur more often. Get help right away if:  You have chest pain.  You feel short of breath.  You have a very bad headache.  You pass out (faint). This information is not intended to replace advice given to you by your health care provider. Make sure you discuss any  questions you have with your health care provider. Document Released: 05/20/2008 Document Revised: 01/17/2016 Document Reviewed: 04/26/2015 Elsevier Interactive Patient Education  2018 [employer].   Benign Positional Vertigo Vertigo is the feeling that you or your surroundings are moving when they are not. Benign positional vertigo is the most common form of vertigo. The cause of this condition is not serious (is benign). This condition is triggered by certain movements and positions (is positional). This condition can be dangerous if it occurs while you are doing something that could endanger you or others, such as driving. What are the causes? In many cases, the cause of this condition is not known. It may be caused by a disturbance in an area of the inner ear that helps your brain to sense movement and balance. This disturbance can be caused by a viral infection (labyrinthitis), head injury, or repetitive motion. What increases the risk? This condition is more likely to develop in:  Women.  People who are 70 years of age or older.  What are the signs or symptoms? Symptoms of this condition usually happen when you move your head or your eyes in different directions. Symptoms may start suddenly, and they usually last for less than a minute. Symptoms may include:  Loss of balance and falling.  Feeling like you are spinning or moving.  Feeling like your surroundings are spinning or moving.  Nausea and vomiting.  Blurred vision.  Dizziness.  Involuntary eye movement (nystagmus).  Symptoms can be mild and cause only slight  annoyance, or they can be severe and interfere with daily life. Episodes of benign positional vertigo may return (recur) over time, and they may be triggered by certain movements. Symptoms may improve over time. How is this diagnosed? This condition is usually diagnosed by medical history and a physical exam of the head, neck, and ears. You may be referred to a  health care provider who specializes in ear, nose, and throat (ENT) problems (otolaryngologist) or a provider who specializes in disorders of the nervous system (neurologist). You may have additional testing, including:  MRI.  A CT scan.  Eye movement tests. Your health care provider may ask you to change positions quickly while he or she watches you for symptoms of benign positional vertigo, such as nystagmus. Eye movement may be tested with an electronystagmogram (ENG), caloric stimulation, the Dix-Hallpike test, or the roll test.  An electroencephalogram (EEG). This records electrical activity in your brain.  Hearing tests.  How is this treated? Usually, your health care provider will treat this by moving your head in specific positions to adjust your inner ear back to normal. Surgery may be needed in severe cases, but this is rare. In some cases, benign positional vertigo may resolve on its own in 2-4 weeks. Follow these instructions at home: Safety  Move slowly.Avoid sudden body or head movements.  Avoid driving.  Avoid operating heavy machinery.  Avoid doing any tasks that would be dangerous to you or others if a vertigo episode would occur.  If you have trouble walking or keeping your balance, try using a cane for stability. If you feel dizzy or unstable, sit down right away.  Return to your normal activities as told by your health care provider. Ask your health care provider what activities are safe for you. General instructions  Take over-the-counter and prescription medicines only as told by your health care provider.  Avoid certain positions or movements as told by your health care provider.  Drink enough fluid to keep your urine clear or pale yellow.  Keep all follow-up visits as told by your health care provider. This is important. Contact a health care provider if:  You have a fever.  Your condition gets worse or you develop new symptoms.  Your family or  friends notice any behavioral changes.  Your nausea or vomiting gets worse.  You have numbness or a "pins and needles" sensation. Get help right away if:  You have difficulty speaking or moving.  You are always dizzy.  You faint.  You develop severe headaches.  You have weakness in your legs or arms.  You have changes in your hearing or vision.  You develop a stiff neck.  You develop sensitivity to light. This information is not intended to replace advice given to you by your health care provider. Make sure you discuss any questions you have with your health care provider. Document Released: 05/19/2006 Document Revised: 01/17/2016 Document Reviewed: 12/04/2014 Elsevier Interactive Patient Education  [employer].

## 2017-09-21 NOTE — Progress Notes (Addendum)
HPI:  [name] anxiety, heart murmur, palpitations here for an acute visit for several issues.  First of all she has had some vertigo for the last 3 days.  Brief episodes of vertigo triggered by specific movements.  She has had this twice in the past, once about a year and a half ago. No HA, weakness, numbness, hearing loss. In the past flonase and musinex helped. This has caused her some anxiety. She has a hx of palpitations and heart murmur. Evaluated by cardiology a few years ago. Endocrinologist she is seeing for concern for hypoglycemia told her murmur is prominent and she has noticed palpitations several times per week for the last few weeks.  Symptoms occur at rest and not with activity. She has been stressed out about the possible blood sugar issues.  Otherwise, denies increased stress or caffeine.  She wants to be reevaluated with her cardiologist.  She contacted their office and for insurance purposes they need a referral from [place].  Denies chest pain, syncope, sustained palpitations, difficulty breathing, swelling.  ROS: See pertinent positives and negatives per HPI.  Past Medical History:  Diagnosis Date  . Anxiety    on zyprexa in the past - stopped medication in 2015  . Blood in stool   . Chicken pox   . Gestational diabetes    gestational diabetes  . Heart murmur    and palpitations, s/p eval with cardiology in 2014  . Urinary tract infection     Past Surgical History:  Procedure Laterality Date  . CESAREAN SECTION     [id number]    Family History  Problem Relation Age of Onset  . Diabetes Paternal Grandfather     Social History   Socioeconomic History  . Marital status: Married    Spouse name: None  . Number of children: None  . Years of education: None  . Highest education level: None  Social Needs  . Financial resource strain: None  . Food insecurity - worry: None  . Food insecurity - inability: None  . Transportation needs - medical: None  .  Transportation needs - non-medical: None  Occupational History  . None  Tobacco Use  . Smoking status: Never Smoker  . Smokeless tobacco: Never Used  Substance and Sexual Activity  . Alcohol use: Yes    Comment: occasional  . Drug use: No  . Sexual activity: None  Other Topics Concern  . None  Social History Narrative   Work or School: [employer] Situation: Lives with two children and husband      Spiritual Beliefs: Catholic      Lifestyle: no regular exercise; diet is good           Current Outpatient Medications:  .  Blood Glucose Monitoring Suppl (FREESTYLE LITE) DEVI, Use as directed to check blood sugar once a day, Disp: 1 each, Rfl: 0 .  glucose blood (FREESTYLE LITE) test strip, Use as instructed once a day, Disp: 100 each, Rfl: 1 .  Lancets (FREESTYLE) lancets, Use as instructed to check blood sugar, Disp: 100 each, Rfl: 1 .  meclizine (ANTIVERT) 12.5 MG tablet, Take 1 tablet (12.5 mg total) by mouth 3 (three) times daily as needed for dizziness., Disp: 30 tablet, Rfl: 0  EXAM:  Vitals:   09/21/17 1344  BP: 120/70  Pulse: 82  Temp: 98.4 F (36.9 C)  SpO2: 99%    Body mass index is 24.27 kg/m.  GENERAL: vitals reviewed and  listed above, alert, oriented, appears well hydrated and in no acute distress  HEENT: atraumatic, conjunttiva clear, no obvious abnormalities on inspection of external nose and ears, normal appearance of ear canals and TMs w/ clear effusion R, clear nasal congestion, mild post oropharyngeal erythema with PND, no tonsillar edema or exudate, no sinus TTP  NECK: no obvious masses on inspection  LUNGS: clear to auscultation bilaterally, no wheezes, rales or rhonchi, good air movement  CV: HRRR, 2/6 SEM LSB no peripheral edema  MS: moves all extremities without noticeable abnormality  PSYCH/NEURO: pleasant and cooperative, no obvious depression, anxious demeanor, speech and thought processing grossly intact, cranial nerves II  through XII grossly intact, finger to nose -,  Marye Round is mildly positive to the left  ASSESSMENT AND PLAN:  Discussed the following assessment and plan:  Vertigo - Plan: Ambulatory referral to Physical Therapy -Given history, suspect BPPV -Advised no driving with vertigo, meclizine as needed provided for symptoms, advised to be the rehab referral placed -Advised follow-up resolved with treatment in the next few weeks, sooner if any worsening or new symptoms -Also advised to go ahead and treat the eustachian tube dysfunction with Flonase  Heart murmur - Plan: Ambulatory referral to Cardiology Palpitations - Plan: Ambulatory referral to Cardiology -Cardiology referral placed per her request for re-eval  -Patient advised to return or notify a doctor immediately if symptoms worsen or persist or new concerns arise.  Patient Instructions  -We placed a referral for you as discussed to the cardiologist per your request and to the physical therapist for vestibular rehabilitation for the vertigo. It usually takes about 1-2 weeks to process and schedule this referral. If you have not heard from [place] regarding this appointment in 2 weeks please contact our office.  -Meclizine to the pharmacy for the vertigo as well.  Please do not drive with vertigo.  -Flonase 2 sprays each nostril daily for 2 weeks for the fluid behind the eardrum.  -Please let us know if the vertigo does not resolve with the treatment.   Palpitations A palpitation is the feeling that your heart:  Has an uneven (irregular) heartbeat.  Is beating faster than normal.  Is fluttering.  Is skipping a beat.  This is usually not a serious problem. In some cases, you may need more medical tests. Follow these instructions at home:  Avoid: ? Caffeine in coffee, tea, soft drinks, diet pills, and energy drinks. ? Chocolate. ? Alcohol.  Do not use any tobacco products. These include cigarettes, chewing tobacco, and  e-cigarettes. If you need help quitting, ask your doctor.  Try to reduce your stress. These things may help: ? Yoga. ? Meditation. ? Physical activity. Swimming, jogging, and walking are good choices. ? A method that helps you use your mind to control things in your body, like heartbeats (biofeedback).  Get plenty of rest and sleep.  Take over-the-counter and prescription medicines only as told by your doctor.  Keep all follow-up visits as told by your doctor. This is important. Contact a doctor if:  Your heartbeat is still fast or uneven after 24 hours.  Your palpitations occur more often. Get help right away if:  You have chest pain.  You feel short of breath.  You have a very bad headache.  You pass out (faint). This information is not intended to replace advice given to you by your health care provider. Make sure you discuss any questions you have with your health care provider. Document Released: 05/20/2008 Document  Revised: 01/17/2016 Document Reviewed: 04/26/2015 Elsevier Interactive Patient Education  2018 [employer].   Benign Positional Vertigo Vertigo is the feeling that you or your surroundings are moving when they are not. Benign positional vertigo is the most common form of vertigo. The cause of this condition is not serious (is benign). This condition is triggered by certain movements and positions (is positional). This condition can be dangerous if it occurs while you are doing something that could endanger you or others, such as driving. What are the causes? In many cases, the cause of this condition is not known. It may be caused by a disturbance in an area of the inner ear that helps your brain to sense movement and balance. This disturbance can be caused by a viral infection (labyrinthitis), head injury, or repetitive motion. What increases the risk? This condition is more likely to develop in:  Women.  People who are 81 years of age or older.  What  are the signs or symptoms? Symptoms of this condition usually happen when you move your head or your eyes in different directions. Symptoms may start suddenly, and they usually last for less than a minute. Symptoms may include:  Loss of balance and falling.  Feeling like you are spinning or moving.  Feeling like your surroundings are spinning or moving.  Nausea and vomiting.  Blurred vision.  Dizziness.  Involuntary eye movement (nystagmus).  Symptoms can be mild and cause only slight annoyance, or they can be severe and interfere with daily life. Episodes of benign positional vertigo may return (recur) over time, and they may be triggered by certain movements. Symptoms may improve over time. How is this diagnosed? This condition is usually diagnosed by medical history and a physical exam of the head, neck, and ears. You may be referred to a health care provider who specializes in ear, nose, and throat (ENT) problems (otolaryngologist) or a provider who specializes in disorders of the nervous system (neurologist). You may have additional testing, including:  MRI.  A CT scan.  Eye movement tests. Your health care provider may ask you to change positions quickly while he or she watches you for symptoms of benign positional vertigo, such as nystagmus. Eye movement may be tested with an electronystagmogram (ENG), caloric stimulation, the Dix-Hallpike test, or the roll test.  An electroencephalogram (EEG). This records electrical activity in your brain.  Hearing tests.  How is this treated? Usually, your health care provider will treat this by moving your head in specific positions to adjust your inner ear back to normal. Surgery may be needed in severe cases, but this is rare. In some cases, benign positional vertigo may resolve on its own in 2-4 weeks. Follow these instructions at home: Safety  Move slowly.Avoid sudden body or head movements.  Avoid driving.  Avoid operating  heavy machinery.  Avoid doing any tasks that would be dangerous to you or others if a vertigo episode would occur.  If you have trouble walking or keeping your balance, try using a cane for stability. If you feel dizzy or unstable, sit down right away.  Return to your normal activities as told by your health care provider. Ask your health care provider what activities are safe for you. General instructions  Take over-the-counter and prescription medicines only as told by your health care provider.  Avoid certain positions or movements as told by your health care provider.  Drink enough fluid to keep your urine clear or pale yellow.  Keep all  follow-up visits as told by your health care provider. This is important. Contact a health care provider if:  You have a fever.  Your condition gets worse or you develop new symptoms.  Your family or friends notice any behavioral changes.  Your nausea or vomiting gets worse.  You have numbness or a "pins and needles" sensation. Get help right away if:  You have difficulty speaking or moving.  You are always dizzy.  You faint.  You develop severe headaches.  You have weakness in your legs or arms.  You have changes in your hearing or vision.  You develop a stiff neck.  You develop sensitivity to light. This information is not intended to replace advice given to you by your health care provider. Make sure you discuss any questions you have with your health care provider. Document Released: 05/19/2006 Document Revised: 01/17/2016 Document Reviewed: 12/04/2014 Elsevier Interactive Patient Education  2018 [place], DO

## 2017-09-21 NOTE — Telephone Encounter (Signed)
Pt is here now for office visit.

## 2017-09-21 NOTE — Telephone Encounter (Signed)
Patient states she has had a couple episodes of dizziness this morning- she does not feel faint. She has taken Mucinex this morning because this has worked for her in the past. Patient is at work now and has an appointment for this afternoon. Reason for Disposition . [1] MILD dizziness (e.g., vertigo; walking normally) AND [2] has NOT been evaluated by physician for this  Answer Assessment - Initial Assessment Questions 1. DESCRIPTION: "Describe your dizziness."     Almost like stepping on cruise ship 2. VERTIGO: "Do you feel like either you or the room is spinning or tilting?"      Up and down for brief second 3. LIGHTHEADED: "Do you feel lightheaded?" (e.g., somewhat faint, woozy, weak upon standing)     no 4. SEVERITY: "How bad is it?"  "Can you walk?"   - MILD - Feels unsteady but walking normally.   - MODERATE - Feels very unsteady when walking, but not falling; interferes with normal activities (e.g., school, work) .   - SEVERE - Unable to walk without falling (requires assistance).     Only last a second or 2- happens repeatedly- mild 5. ONSET:  "When did the dizziness begin?"     1 week 6. AGGRAVATING FACTORS: "Does anything make it worse?" (e.g., standing, change in head position)     Standing/walking    Possible change in head position 7. CAUSE: "What do you think is causing the dizziness?"     Some sinus congestion recently- slight 8. RECURRENT SYMPTOM: "Have you had dizziness before?" If so, ask: "When was the last time?" "What happened that time?"     Yes- summer 2017  Patient took Mucinex got better 9. OTHER SYMPTOMS: "Do you have any other symptoms?" (e.g., headache, weakness, numbness, vomiting, earache)     Palpations- seems to be random- patient is seeing endocrinologist for low blood sugar- it is fine now  10. PREGNANCY: "Is there any chance you are pregnant?" "When was your last menstrual period?"       No- LMP 08/27/2017  Protocols used: DIZZINESS - VERTIGO-A-AH

## 2017-10-08 ENCOUNTER — Ambulatory Visit: Payer: 59 | Admitting: Cardiology

## 2017-10-12 ENCOUNTER — Ambulatory Visit: Payer: 59 | Admitting: Physician Assistant

## 2017-10-15 NOTE — Progress Notes (Signed)
Cardiology Office Note    Date:  10/16/2017   ID:  [name], DOB 05/02/1981, MRN [id number]  PCP:  Lucretia Kern, DO  Cardiologist: Sinclair Grooms, MD   Chief Complaint  Patient presents with  . Cardiac Valve Problem  . Irregular Heart Beat    History of Present Illness:  [name] is a 37 y.o. female last seen in 2014 when she was evaluated for palpitations and a systolic heart murmur.  Previously evaluated in 7425 for a systolic heart murmur.  Also had complaints at that time of vague palpitations.  Back quality of palpitation has resolved but recently she has had brief episodes of heart irregularity.  Very poorly characterized.  Occurs when she is inactive.  It frightens her.  No episodes last longer than several seconds.  No obvious precipitants.  Generally, no cardiac complaints otherwise.  States Dr. Maudie Mercury also was concerned about the murmur.  Prior workup demonstrated a structurally normal heart.  She did have EKG that demonstrated a vertical/right axis deviation but no evidence of right ventricular enlargement or hypertrophy.    Past Medical History:  Diagnosis Date  . Anxiety    on zyprexa in the past - stopped medication in 2015  . Blood in stool   . Chicken pox   . Gestational diabetes    gestational diabetes  . Heart murmur    and palpitations, s/p eval with cardiology in 2014  . Urinary tract infection     Past Surgical History:  Procedure Laterality Date  . CESAREAN SECTION     [phone]    Current Medications: Outpatient Medications Prior to Visit  Medication Sig Dispense Refill  . Blood Glucose Monitoring Suppl (FREESTYLE LITE) DEVI Use as directed to check blood sugar once a day 1 each 0  . glucose blood (FREESTYLE LITE) test strip Use as instructed once a day 100 each 1  . Lancets (FREESTYLE) lancets Use as instructed to check blood sugar 100 each 1  . meclizine (ANTIVERT) 12.5 MG tablet Take 1 tablet (12.5 mg total) by mouth 3 (three) times  daily as needed for dizziness. 30 tablet 0   No facility-administered medications prior to visit.      Allergies:   Patient has no known allergies.   Social History   Socioeconomic History  . Marital status: Married    Spouse name: None  . Number of children: None  . Years of education: None  . Highest education level: None  Social Needs  . Financial resource strain: None  . Food insecurity - worry: None  . Food insecurity - inability: None  . Transportation needs - medical: None  . Transportation needs - non-medical: None  Occupational History  . None  Tobacco Use  . Smoking status: Never Smoker  . Smokeless tobacco: Never Used  Substance and Sexual Activity  . Alcohol use: Yes    Comment: occasional  . Drug use: No  . Sexual activity: None  Other Topics Concern  . None  Social History Narrative   Work or School: [employer] Situation: Lives with two children and husband      Spiritual Beliefs: Catholic      Lifestyle: no regular exercise; diet is good           Family History:  The patient's family history includes Diabetes in her paternal grandfather; Healthy in her father and mother.   ROS:   Please see the  history of present illness.    At times cannot take a deep breath.  This feeling is momentary.  Recurring headaches.  Has occasional dizziness not associated with palpitations.  Fingers and toes at times feel numb.  Anxious about her health. All other systems reviewed and are negative.   PHYSICAL EXAM:   VS:  BP 122/74   Pulse 78   Ht 5\' 2"  (1.575 m)   Wt 133 lb 9.6 oz (60.6 kg)   BMI 24.44 kg/m    GEN: Well nourished, well developed, in no acute distress  HEENT: normal  Neck: no JVD, carotid bruits, or masses Cardiac: Left mid to upper sternal border systolic murmur that is unchanged by Valsalva and standing.  No diastolic murmur.  No gallop or rub.  2/6 RRR; no diastolic murmurs, rubs, or gallops,no edema  Respiratory:  clear to  auscultation bilaterally, normal work of breathing GI: soft, nontender, nondistended, + BS MS: no deformity or atrophy  Skin: warm and dry, no rash Neuro:  Alert and Oriented x 3, Strength and sensation are intact Psych: euthymic mood, full affect  Wt Readings from Last 3 Encounters:  10/16/17 133 lb 9.6 oz (60.6 kg)  09/21/17 132 lb 11.2 oz (60.2 kg)  07/09/17 130 lb 14.4 oz (59.4 kg)      Studies/Labs Reviewed:   EKG:  EKG right axis deviation calculated at 106 degrees.  Otherwise normal.  No interval change when compared to tracing from 2014.  No evidence of ventricular hypertrophy.  Recent Labs: 06/16/2017: BUN 10; Creatinine, Ser 0.64; Hemoglobin 12.9; Platelets 276.0; Potassium 3.8; Sodium 138; TSH 2.35   Lipid Panel    Component Value Date/Time   CHOL 171 07/03/2016 0905   TRIG 42.0 07/03/2016 0905   HDL 89.10 07/03/2016 0905   CHOLHDL 2 07/03/2016 0905   VLDL 8.4 07/03/2016 0905   LDLCALC 73 07/03/2016 0905    Additional studies/ records that were reviewed today include:   2014 2D Doppler echocardiogram: Study Conclusions  Left ventricle: The cavity size was normal. Systolic function was normal. The estimated ejection fraction was in the range of 55% to 60%. Wall motion was normal; there were no regional wall motion abnormalities.     ASSESSMENT:    1. Systolic murmur - had eval with cardiology in 2014 and all ok per pt report   2. Palpitations      PLAN:  In order of problems listed above:  1. Flow murmur/physiologic is still the clinical diagnosis.  EKG is not changed.  Right axis deviation is not associated with structural abnormality on echo.  Plan continued observation.  Endocarditis prophylaxis is not needed. 2. Poorly characterized and very brief.  She was reassured.  Encouraged aerobic activity.  Notify us if limitations in activity related chest pain or dyspnea.    Medication Adjustments/Labs and Tests Ordered: Current medicines are  reviewed at length with the patient today.  Concerns regarding medicines are outlined above.  Medication changes, Labs and Tests ordered today are listed in the Patient Instructions below. Patient Instructions  Medication Instructions:  Your physician recommends that you continue on your current medications as directed. Please refer to the Current Medication list given to you today.   Labwork: None  Testing/Procedures: None  Follow-Up: Your physician recommends that you schedule a follow-up appointment as needed with Dr. Tamala Julian.   Any Other Special Instructions Will Be Listed Below (If Applicable).     If you need a refill on your cardiac medications before  your next appointment, please call your pharmacy.      Signed, Sinclair Grooms, MD  10/16/2017 11:04 AM    [facility] [place], [place], [place]  [address] Phone: [phone]; Fax: [phone]

## 2017-10-16 ENCOUNTER — Ambulatory Visit (INDEPENDENT_AMBULATORY_CARE_PROVIDER_SITE_OTHER): Payer: No Typology Code available for payment source | Admitting: Interventional Cardiology

## 2017-10-16 ENCOUNTER — Encounter: Payer: Self-pay | Admitting: Interventional Cardiology

## 2017-10-16 VITALS — BP 122/74 | HR 78 | Ht 62.0 in | Wt 133.6 lb

## 2017-10-16 DIAGNOSIS — R011 Cardiac murmur, unspecified: Secondary | ICD-10-CM | POA: Diagnosis not present

## 2017-10-16 DIAGNOSIS — R002 Palpitations: Secondary | ICD-10-CM

## 2017-10-16 NOTE — Patient Instructions (Signed)
Medication Instructions:  Your physician recommends that you continue on your current medications as directed. Please refer to the Current Medication list given to you today.  Labwork: None  Testing/Procedures: None  Follow-Up: Your physician recommends that you schedule a follow-up appointment as needed with Dr. Smith.     Any Other Special Instructions Will Be Listed Below (If Applicable).     If you need a refill on your cardiac medications before your next appointment, please call your pharmacy.   

## 2017-12-24 ENCOUNTER — Encounter: Payer: Self-pay | Admitting: *Deleted

## 2017-12-24 ENCOUNTER — Ambulatory Visit: Payer: No Typology Code available for payment source | Admitting: Neurology

## 2017-12-24 ENCOUNTER — Encounter: Payer: Self-pay | Admitting: Neurology

## 2017-12-24 VITALS — BP 124/77 | HR 98 | Ht 62.0 in | Wt 135.0 lb

## 2017-12-24 DIAGNOSIS — R51 Headache with orthostatic component, not elsewhere classified: Secondary | ICD-10-CM

## 2017-12-24 DIAGNOSIS — G43009 Migraine without aura, not intractable, without status migrainosus: Secondary | ICD-10-CM | POA: Diagnosis not present

## 2017-12-24 DIAGNOSIS — R519 Headache, unspecified: Secondary | ICD-10-CM

## 2017-12-24 DIAGNOSIS — R42 Dizziness and giddiness: Secondary | ICD-10-CM

## 2017-12-24 DIAGNOSIS — R2689 Other abnormalities of gait and mobility: Secondary | ICD-10-CM | POA: Diagnosis not present

## 2017-12-24 DIAGNOSIS — G43709 Chronic migraine without aura, not intractable, without status migrainosus: Secondary | ICD-10-CM | POA: Insufficient documentation

## 2017-12-24 MED ORDER — ONDANSETRON 4 MG PO TBDP: 4.0000 mg | ORAL_TABLET | Freq: Three times a day (TID) | ORAL | 11 refills | Status: DC | PRN

## 2017-12-24 MED ORDER — NAPROXEN 500 MG PO TABS: ORAL_TABLET | ORAL | 6 refills | Status: DC

## 2017-12-24 MED FILL — NAPROXEN 500 MG TABLET: 500 | 30 days supply | Qty: 60 | Fill #0

## 2017-12-24 MED FILL — ONDANSETRON ODT 4 MG TABLET: 4 | 10 days supply | Qty: 30 | Fill #0

## 2017-12-24 NOTE — Patient Instructions (Signed)
Naprozen 500mg  twice daily 2 days before migraine for 5-6 days Ondansetron for nausea MRI brain  Ondansetron oral dissolving tablet What is this medicine? ONDANSETRON (on DAN se tron) is used to treat nausea and vomiting caused by chemotherapy. It is also used to prevent or treat nausea and vomiting after surgery. This medicine may be used for other purposes; ask your health care provider or pharmacist if you have questions. COMMON BRAND NAME(S): Zofran ODT What should I tell my health care provider before I take this medicine? They need to know if you have any of these conditions: -heart disease -history of irregular heartbeat -liver disease -low levels of magnesium or potassium in the blood -an unusual or allergic reaction to ondansetron, granisetron, other medicines, foods, dyes, or preservatives -pregnant or trying to get pregnant -breast-feeding How should I use this medicine? These tablets are made to dissolve in the mouth. Do not try to push the tablet through the foil backing. With dry hands, peel away the foil backing and gently remove the tablet. Place the tablet in the mouth and allow it to dissolve, then swallow. While you may take these tablets with water, it is not necessary to do so. Talk to your pediatrician regarding the use of this medicine in children. Special care may be needed. Overdosage: If you think you have taken too much of this medicine contact a poison control center or emergency room at once. NOTE: This medicine is only for you. Do not share this medicine with others. What if I miss a dose? If you miss a dose, take it as soon as you can. If it is almost time for your next dose, take only that dose. Do not take double or extra doses. What may interact with this medicine? Do not take this medicine with any of the following medications: -apomorphine -certain medicines for fungal infections like fluconazole, itraconazole, ketoconazole, posaconazole,  voriconazole -cisapride -dofetilide -dronedarone -pimozide -thioridazine -ziprasidone This medicine may also interact with the following medications: -carbamazepine -certain medicines for depression, anxiety, or psychotic disturbances -fentanyl -linezolid -MAOIs like Carbex, Eldepryl, Marplan, Nardil, and Parnate -methylene blue (injected into a vein) -other medicines that prolong the QT interval (cause an abnormal heart rhythm) -phenytoin -rifampicin -tramadol This list may not describe all possible interactions. Give your health care provider a list of all the medicines, herbs, non-prescription drugs, or dietary supplements you use. Also tell them if you smoke, drink alcohol, or use illegal drugs. Some items may interact with your medicine. What should I watch for while using this medicine? Check with your doctor or health care professional as soon as you can if you have any sign of an allergic reaction. What side effects may I notice from receiving this medicine? Side effects that you should report to your doctor or health care professional as soon as possible: -allergic reactions like skin rash, itching or hives, swelling of the face, lips, or tongue -breathing problems -confusion -dizziness -fast or irregular heartbeat -feeling faint or lightheaded, falls -fever and chills -loss of balance or coordination -seizures -sweating -swelling of the hands and feet -tightness in the chest -tremors -unusually weak or tired Side effects that usually do not require medical attention (report to your doctor or health care professional if they continue or are bothersome): -constipation or diarrhea -headache This list may not describe all possible side effects. Call your doctor for medical advice about side effects. You may report side effects to FDA at 1-800-FDA-1088. Where should I keep my medicine? Keep  out of the reach of children. Store between 2 and 30 degrees C (36 and 86  degrees F). Throw away any unused medicine after the expiration date. NOTE: This sheet is a summary. It may not cover all possible information. If you have questions about this medicine, talk to your doctor, pharmacist, or health care provider.  2018 Elsevier/Gold Standard (2013-05-18 16:21:52)  Naproxen and naproxen sodium oral immediate-release tablets What is this medicine? NAPROXEN (na PROX en) is a non-steroidal anti-inflammatory drug (NSAID). It is used to reduce swelling and to treat pain. This medicine may be used for dental pain, headache, or painful monthly periods. It is also used for painful joint and muscular problems such as arthritis, tendinitis, bursitis, and gout. This medicine may be used for other purposes; ask your health care provider or pharmacist if you have questions. COMMON BRAND NAME(S): Aflaxen, Aleve, Aleve Arthritis, All Day Relief, Anaprox, Anaprox DS, Naprosyn, [facility] Naproxen Sodium What should I tell my health care provider before I take this medicine? They need to know if you have any of these conditions: -asthma -cigarette smoker -drink more than 3 alcohol containing drinks a day -heart disease or circulation problems such as heart failure or leg edema (fluid retention) -high blood pressure -kidney disease -liver disease -stomach bleeding or ulcers -an unusual or allergic reaction to naproxen, aspirin, other NSAIDs, other medicines, foods, dyes, or preservatives -pregnant or trying to get pregnant -breast-feeding How should I use this medicine? Take this medicine by mouth with a glass of water. Follow the directions on the prescription label. Take it with food if your stomach gets upset. Try to not lie down for at least 10 minutes after you take it. Take your medicine at regular intervals. Do not take your medicine more often than directed. Long-term, continuous use may increase the risk of heart attack or stroke. A special MedGuide will be given to you  by the pharmacist with each prescription and refill. Be sure to read this information carefully each time. Talk to your pediatrician regarding the use of this medicine in children. Special care may be needed. Overdosage: If you think you have taken too much of this medicine contact a poison control center or emergency room at once. NOTE: This medicine is only for you. Do not share this medicine with others. What if I miss a dose? If you miss a dose, take it as soon as you can. If it is almost time for your next dose, take only that dose. Do not take double or extra doses. What may interact with this medicine? -alcohol -aspirin -cidofovir -diuretics -lithium -methotrexate -other drugs for inflammation like ketorolac or prednisone -pemetrexed -probenecid -warfarin This list may not describe all possible interactions. Give your health care provider a list of all the medicines, herbs, non-prescription drugs, or dietary supplements you use. Also tell them if you smoke, drink alcohol, or use illegal drugs. Some items may interact with your medicine. What should I watch for while using this medicine? Tell your doctor or health care professional if your pain does not get better. Talk to your doctor before taking another medicine for pain. Do not treat yourself. This medicine does not prevent heart attack or stroke. In fact, this medicine may increase the chance of a heart attack or stroke. The chance may increase with longer use of this medicine and in people who have heart disease. If you take aspirin to prevent heart attack or stroke, talk with your doctor or health care professional. Do  not take other medicines that contain aspirin, ibuprofen, or naproxen with this medicine. Side effects such as stomach upset, nausea, or ulcers may be more likely to occur. Many medicines available without a prescription should not be taken with this medicine. This medicine can cause ulcers and bleeding in the  stomach and intestines at any time during treatment. Do not smoke cigarettes or drink alcohol. These increase irritation to your stomach and can make it more susceptible to damage from this medicine. Ulcers and bleeding can happen without warning symptoms and can cause death. You may get drowsy or dizzy. Do not drive, use machinery, or do anything that needs mental alertness until you know how this medicine affects you. Do not stand or sit up quickly, especially if you are an older patient. This reduces the risk of dizzy or fainting spells. This medicine can cause you to bleed more easily. Try to avoid damage to your teeth and gums when you brush or floss your teeth. What side effects may I notice from receiving this medicine? Side effects that you should report to your doctor or health care professional as soon as possible: -black or bloody stools, blood in the urine or vomit -blurred vision -chest pain -difficulty breathing or wheezing -nausea or vomiting -severe stomach pain -skin rash, skin redness, blistering or peeling skin, hives, or itching -slurred speech or weakness on one side of the body -swelling of eyelids, throat, lips -unexplained weight gain or swelling -unusually weak or tired -yellowing of eyes or skin Side effects that usually do not require medical attention (report to your doctor or health care professional if they continue or are bothersome): -constipation -headache -heartburn This list may not describe all possible side effects. Call your doctor for medical advice about side effects. You may report side effects to FDA at 1-800-FDA-1088. Where should I keep my medicine? Keep out of the reach of children. Store at room temperature between 15 and 30 degrees C (59 and 86 degrees F). Keep container tightly closed. Throw away any unused medicine after the expiration date. NOTE: This sheet is a summary. It may not cover all possible information. If you have questions about  this medicine, talk to your doctor, pharmacist, or health care provider.  2018 Elsevier/Gold Standard (2009-08-13 20:10:16)

## 2017-12-24 NOTE — Progress Notes (Signed)
[facility]    Provider:  Dr Jaynee Eagles Referring Provider: Lucretia Kern, DO Primary Care Physician:  Lucretia Kern, DO  CC: Menstrual migraines  HPI:  [name] is a 37 y.o. female here as a referral from Dr. Maudie Mercury for migraines.  No significant past medical history mild anxiety. She has migraines a few days before her period or a few days afterwards they seem to cluster. May happen other times of the month. Pounding in the forehead, movement makes it worse, she can wake up with the headaches. Worse also in the evening. Some nausea. She is very regular. Naproxen helps and extra strength tylenol help. Can last 4-24 hours untreated. No prodrome. Can have some "hangover". Alcohol is a trigger. 5-6 migraine days a month. Dad has headaches. She has episodes of dizziness. No vision loss, no sensory symptoms. The dizziness/veertigo is worsening. No other focal neurologic deficits, associated symptoms, inciting events or modifiable factors.  Reviewed notes, labs and imaging from outside physicians, which showed:  TSH normal  Reviewed referring physician notes.  Patient's annual exam, reviewed, normal, no significant past medical history.  Regular stable menstrual pattern.  Patient has a history of migraines.  Headaches occur when she has her periods sometimes occur at other times.  She is taken naproxen or acetaminophen extra strength which generally seems to knock it out.  Review of Systems: Patient complains of symptoms per HPI as well as the following symptoms: Headache, murmur, dizziness, spinning sensation, anxiety, insomnia. Pertinent negatives and positives per HPI. All others negative.   Social History   Socioeconomic History  . Marital status: Married    Spouse name: Not on file  . Number of children: 2  . Years of education: 63  . Highest education level: Professional school degree (e.g., MD, DDS, DVM, JD)  Occupational History  . Occupation: audiologist  Social  Needs  . Financial resource strain: Not on file  . Food insecurity:    Worry: Not on file    Inability: Not on file  . Transportation needs:    Medical: Not on file    Non-medical: Not on file  Tobacco Use  . Smoking status: Never Smoker  . Smokeless tobacco: Never Used  Substance and Sexual Activity  . Alcohol use: Yes    Comment: occasional  . Drug use: No  . Sexual activity: Not on file  Lifestyle  . Physical activity:    Days per week: Not on file    Minutes per session: Not on file  . Stress: Not on file  Relationships  . Social connections:    Talks on phone: Not on file    Gets together: Not on file    Attends religious service: Not on file    Active member of club or organization: Not on file    Attends meetings of clubs or organizations: Not on file    Relationship status: Not on file  . Intimate partner violence:    Fear of current or ex partner: Not on file    Emotionally abused: Not on file    Physically abused: Not on file    Forced sexual activity: Not on file  Other Topics Concern  . Not on file  Social History Narrative   Work or School: [employer] Situation: Lives with two children and husband      Spiritual Beliefs: Catholic      Lifestyle: no regular exercise; diet is good  Caffeine: 1 cup of coffee & 1 green tea daily    Family History  Problem Relation Age of Onset  . Healthy Mother   . Healthy Father   . Diabetes Paternal Grandmother     Past Medical History:  Diagnosis Date  . Anxiety    on zyprexa in the past - stopped medication in 2015  . Blood in stool   . Chicken pox   . Gestational diabetes    gestational diabetes  . Heart murmur    and palpitations, s/p eval with cardiology in 2014  . Hypoglycemia    has seen endocrinologist  . Urinary tract infection     Past Surgical History:  Procedure Laterality Date  . CESAREAN SECTION     [id number]    Current Outpatient Medications  Medication Sig Dispense  Refill  . Blood Glucose Monitoring Suppl (FREESTYLE LITE) DEVI Use as directed to check blood sugar once a day 1 each 0  . glucose blood (FREESTYLE LITE) test strip Use as instructed once a day 100 each 1  . Lancets (FREESTYLE) lancets Use as instructed to check blood sugar 100 each 1  . naproxen (NAPROSYN) 500 MG tablet Naprozen 500mg  twice daily 2 days before migraine for 5-6 days 60 tablet 6  . ondansetron (ZOFRAN-ODT) 4 MG disintegrating tablet Take 1 tablet (4 mg total) by mouth every 8 (eight) hours as needed for nausea. 30 tablet 11   No current facility-administered medications for this visit.     Allergies as of 12/24/2017  . (No Known Allergies)    Vitals: BP 124/77 (BP Location: Right Arm, Patient Position: Sitting)   Pulse 98   Ht 5\' 2"  (1.575 m)   Wt 135 lb (61.2 kg)   BMI 24.69 kg/m  Last Weight:  Wt Readings from Last 1 Encounters:  12/24/17 135 lb (61.2 kg)   Last Height:   Ht Readings from Last 1 Encounters:  12/24/17 5\' 2"  (1.575 m)   Physical exam: Exam: Gen: NAD, conversant, well nourised, well groomed                     CV: RRR, +SEM . No Carotid Bruits. No peripheral edema, warm, nontender Eyes: Conjunctivae clear without exudates or hemorrhage  Neuro: Detailed Neurologic Exam  Speech:    Speech is normal; fluent and spontaneous with normal comprehension.  Cognition:    The patient is oriented to person, place, and time;     recent and remote memory intact;     language fluent;     normal attention, concentration,     fund of knowledge Cranial Nerves:    The pupils are equal, round, and reactive to light. The fundi are normal and spontaneous venous pulsations are present. Visual fields are full to finger confrontation. Extraocular movements are intact. Trigeminal sensation is intact and the muscles of mastication are normal. The face is symmetric. The palate elevates in the midline. Hearing intact. Voice is normal. Shoulder shrug is normal. The  tongue has normal motion without fasciculations.   Coordination:    Normal finger to nose and heel to shin. Normal rapid alternating movements.   Gait:    Heel-toe and tandem gait are normal.   Motor Observation:    No asymmetry, no atrophy, and no involuntary movements noted. Tone:    Normal muscle tone.    Posture:    Posture is normal. normal erect    Strength:    Strength is V/V in the  upper and lower limbs.      Sensation: intact to LT     Reflex Exam:  DTR's:    Deep tendon reflexes in the upper and lower extremities are normal bilaterally.   Toes:    The toes are downgoing bilaterally.   Clonus:    Clonus is absent.        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   MRI of the brain w/wo contrast due to worsening vertigo, dizziness and positional quality to migraines to evaluate for space-occupying lesions, masses, chiari, intrcranial hypertension or any other intracranial pathology.  Orders Placed This Encounter  Procedures  . MR BRAIN W WO CONTRAST    Discussed: To prevent or relieve headaches, try the following: Cool Compress. Lie down and place a cool compress on your head.  Avoid headache triggers. If certain foods or odors seem to have triggered your migraines in the past, avoid them. A headache diary might help you identify triggers.  Include physical activity in your daily routine. Try a daily walk or other moderate aerobic exercise.  Manage stress. Find healthy ways to cope with the stressors, such as delegating tasks on your to-do list.  Practice relaxation techniques. Try deep breathing, yoga, massage and visualization.  Eat regularly. Eating regularly scheduled meals and maintaining a healthy diet might help prevent  headaches. Also, drink plenty of fluids.  Follow a regular sleep schedule. Sleep deprivation might contribute to headaches Consider biofeedback. With this mind-body technique, you learn to control certain bodily functions - such as muscle tension, heart rate and blood pressure - to prevent headaches or reduce headache pain.    Proceed to emergency room if you experience new or worsening symptoms or symptoms do not resolve, if you have new neurologic symptoms or if headache is severe, or for any concerning symptom.   Provided education and documentation from American headache Society toolbox including articles on: chronic migraine medication overuse headache, chronic migraines, prevention of migraines, behavioral and other nonpharmacologic treatments for headache.    Sarina Ill, MD  [facility] [address] [place], [place] [address]  Phone [phone] Fax [phone]

## 2017-12-29 ENCOUNTER — Ambulatory Visit: Payer: No Typology Code available for payment source

## 2017-12-29 DIAGNOSIS — R51 Headache with orthostatic component, not elsewhere classified: Secondary | ICD-10-CM

## 2017-12-29 DIAGNOSIS — R42 Dizziness and giddiness: Secondary | ICD-10-CM

## 2017-12-29 DIAGNOSIS — R2689 Other abnormalities of gait and mobility: Secondary | ICD-10-CM

## 2017-12-29 DIAGNOSIS — R519 Headache, unspecified: Secondary | ICD-10-CM

## 2017-12-29 MED ORDER — GADOPENTETATE DIMEGLUMINE 469.01 MG/ML IV SOLN
12.0000 mL | Freq: Once | INTRAVENOUS | Status: AC | PRN
  Administered 2017-12-29: 12 mL via INTRAVENOUS

## 2017-12-30 ENCOUNTER — Telehealth: Payer: Self-pay | Admitting: *Deleted

## 2017-12-30 NOTE — Telephone Encounter (Signed)
I called patient on behalf of nurse Bethany`s request and gave her results of MRI scan of the brain being unremarkable.  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  She voiced understanding

## 2017-12-30 NOTE — Telephone Encounter (Addendum)
Spoke with patient and discussed that her MRI brain was unremarkable. Pt wanted to know more detail about findings.   ----- Message from Melvenia Beam, MD sent at 12/30/2017  3:07 PM EDT ----- Unremarkable MRI brain thanks   IMPRESSION: Unremarkable MRI scan of the brain with and without contrast.  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

## 2018-06-25 ENCOUNTER — Ambulatory Visit (INDEPENDENT_AMBULATORY_CARE_PROVIDER_SITE_OTHER): Payer: No Typology Code available for payment source

## 2018-06-25 DIAGNOSIS — Z23 Encounter for immunization: Secondary | ICD-10-CM

## 2018-07-28 NOTE — Progress Notes (Signed)
  HPI:  Here for CPE: Computers down - see scanned documentation for details. Reif summary:  Seeing neurology for migraines - had MRI earlier this year. Seeing gyn and dermatologist - UTD. Did flu shot. Saw endocrinologist for possible low blood sugar - reports work up good. Trying to eat healthy.   Past Medical History:  Diagnosis Date  . Anxiety    on zyprexa in the past - stopped medication in 2015  . Blood in stool   . Chicken pox   . Gestational diabetes    gestational diabetes  . Heart murmur    and palpitations, s/p eval with cardiology in 2014  . Hypoglycemia    has seen endocrinologist  . Urinary tract infection     Past Surgical History:  Procedure Laterality Date  . CESAREAN SECTION     [id number]    Family History  Problem Relation Age of Onset  . Healthy Mother   . Healthy Father   . Diabetes Paternal Grandmother     Social History   Socioeconomic History  . Marital status: Married    Spouse name: Not on file  . Number of children: 2  . Years of education: 19  . Highest education level: Professional school degree (e.g., MD, DDS, DVM, JD)  Occupational History  . Occupation: audiologist  Social Needs  . Financial resource strain: Not on file  . Food insecurity:    Worry: Not on file    Inability: Not on file  . Transportation needs:    Medical: Not on file    Non-medical: Not on file  Tobacco Use  . Smoking status: Never Smoker  . Smokeless tobacco: Never Used  Substance and Sexual Activity  . Alcohol use: Yes    Comment: occasional  . Drug use: No  . Sexual activity: Not on file  Lifestyle  . Physical activity:    Days per week: Not on file    Minutes per session: Not on file  . Stress: Not on file  Relationships  . Social connections:    Talks on phone: Not on file    Gets together: Not on file    Attends religious service: Not on file    Active member of club or organization: Not on file    Attends meetings of clubs or  organizations: Not on file    Relationship status: Not on file  Other Topics Concern  . Not on file  Social History Narrative   Work or School: [employer] Situation: Lives with two children and husband      Spiritual Beliefs: Catholic      Lifestyle: no regular exercise; diet is good      Caffeine: 1 cup of coffee & 1 green tea daily     Current Outpatient Medications:  .  Blood Glucose Monitoring Suppl (FREESTYLE LITE) DEVI, Use as directed to check blood sugar once a day, Disp: 1 each, Rfl: 0 .  glucose blood (FREESTYLE LITE) test strip, Use as instructed once a day, Disp: 100 each, Rfl: 1 .  Lancets (FREESTYLE) lancets, Use as instructed to check blood sugar, Disp: 100 each, Rfl: 1 .  ondansetron (ZOFRAN-ODT) 4 MG disintegrating tablet, Take 1 tablet (4 mg total) by mouth every 8 (eight) hours as needed for nausea., Disp: 30 tablet, Rfl: 11    Exam/Assessment and Plan: See scanned documentation, computers down

## 2018-07-29 ENCOUNTER — Ambulatory Visit (INDEPENDENT_AMBULATORY_CARE_PROVIDER_SITE_OTHER): Payer: No Typology Code available for payment source | Admitting: Family Medicine

## 2018-07-29 DIAGNOSIS — Z0000 Encounter for general adult medical examination without abnormal findings: Secondary | ICD-10-CM | POA: Diagnosis not present

## 2018-07-29 LAB — LIPID PANEL
Cholesterol: 168 mg/dL (ref 0–200)
HDL: 79.7 mg/dL (ref >39.00)
LDL CALC: 79 mg/dL (ref 0–99)
NonHDL: 88.17
TRIGLYCERIDES: 48 mg/dL (ref 0.0–149.0)
Total CHOL/HDL Ratio: 2
VLDL: 9.6 mg/dL (ref 0.0–40.0)

## 2018-10-13 ENCOUNTER — Other Ambulatory Visit: Payer: Self-pay | Admitting: Obstetrics and Gynecology

## 2018-10-13 DIAGNOSIS — N6489 Other specified disorders of breast: Secondary | ICD-10-CM

## 2018-10-19 ENCOUNTER — Ambulatory Visit
Admission: RE | Admit: 2018-10-19 | Discharge: 2018-10-19 | Disposition: A | Discharge status: Home / Self Care | Payer: No Typology Code available for payment source | Source: Ambulatory Visit | Attending: Obstetrics and Gynecology | Admitting: Obstetrics and Gynecology

## 2018-10-19 DIAGNOSIS — N6489 Other specified disorders of breast: Secondary | ICD-10-CM

## 2018-12-13 ENCOUNTER — Ambulatory Visit: Payer: Self-pay

## 2018-12-13 NOTE — Telephone Encounter (Signed)
Appt scheduled with PCP 

## 2018-12-13 NOTE — Telephone Encounter (Signed)
Pt called to report COVID-19 symptoms.  She states that she has not been exposed to anyone that has known positive diagnosis. She states her symptoms started last Thursday with dry cough and sore throat.  She states that her symptoms have gotten worse today with fever present. Temp. 99.5. She has had tylenol this AM for headache.  She states that her temperature continues to go up every time she checks her temperature. She denies SOB and chest pain. Home care advice read to patient. Pt verbalized understanding of all instructions. Call transferred to office for follow up with PCP.  Reason for Disposition . 1] COVID-19 infection diagnosed or suspected AND [2] mild symptoms (fever, cough) AND [2] no trouble breathing or other complications  Answer Assessment - Initial Assessment Questions 1. COVID-19 DIAGNOSIS: "Who made your Coronavirus (COVID-19) diagnosis?" "Was it confirmed by a positive lab test?" If not diagnosed by a HCP, ask "Are there lots of cases (community spread) where you live?" (See public health department website, if unsure)   * MAJOR community spread: high number of cases; numbers of cases are increasing; many people hospitalized.   * MINOR community spread: low number of cases; not increasing; few or no people hospitalized     [place] 2. ONSET: "When did the COVID-19 symptoms start?"      Cough fever started this past Thursday with cough and sore throat 3. WORST SYMPTOM: "What is your worst symptom?" (e.g., cough, fever, shortness of breath, muscle aches)     Fever now 99.5  4. COUGH: "How bad is the cough?"      Ok dry cough 5. FEVER: "Do you have a fever?" If so, ask: "What is your temperature, how was it measured, and when did it start?"    99.5  Tylenol this AM for headache 6. RESPIRATORY STATUS: "Describe your breathing?" (e.g., shortness of breath, wheezing, unable to speak)      No 7. BETTER-SAME-WORSE: "Are you getting better, staying the same or getting worse compared  to yesterday?"  If getting worse, ask, "In what way?"     worse 8. HIGH RISK DISEASE: "Do you have any chronic medical problems?" (e.g., asthma, heart or lung disease, weak immune system, etc.)    no 9. PREGNANCY: "Is there any chance you are pregnant?" "When was your last menstrual period?"     No just finished within the past week with cycle 10. OTHER SYMPTOMS: "Do you have any other symptoms?"  (e.g., runny nose, headache, sore throat, loss of smell)       Headache, sore throat, runny nose,  Protocols used: CORONAVIRUS (COVID-19) DIAGNOSED OR SUSPECTED-A-AH

## 2018-12-14 ENCOUNTER — Encounter: Payer: Self-pay | Admitting: Family Medicine

## 2018-12-14 ENCOUNTER — Other Ambulatory Visit: Payer: Self-pay

## 2018-12-14 ENCOUNTER — Ambulatory Visit (INDEPENDENT_AMBULATORY_CARE_PROVIDER_SITE_OTHER): Payer: No Typology Code available for payment source | Admitting: Family Medicine

## 2018-12-14 VITALS — Temp 98.7°F

## 2018-12-14 DIAGNOSIS — J310 Chronic rhinitis: Secondary | ICD-10-CM

## 2018-12-14 MED ORDER — FLUTICASONE PROPIONATE 50 MCG/ACT NA SUSP: 2.0000 | Freq: Every day | NASAL | 0 refills | Status: DC

## 2018-12-14 MED ORDER — CETIRIZINE HCL 10 MG PO TABS: 10.0000 mg | ORAL_TABLET | Freq: Every day | ORAL | 1 refills | Status: DC

## 2018-12-14 MED FILL — FLUTICASONE PROP 50 MCG SPR: 50 | 30 days supply | Qty: 16 | Fill #0

## 2018-12-14 NOTE — Progress Notes (Signed)
Virtual Visit via Video Note  I connected with [name]  on 12/14/18 at  8:30 AM EDT by a video enabled telemedicine application and verified that I am speaking with the correct person using two identifiers.  Location patient: home Location provider:work or home office Persons participating in the virtual visit: patient, provider  I discussed the limitations of evaluation and management by telemedicine and the availability of in person appointments. The patient expressed understanding and agreed to proceed.   HPI:  Acute visit for sinus congestion: -started 4-5 days ago -scratchy throat, nasal congestion, sinus pressure, mild cough with some phlegm, low grade temp of high of 99.5 yesterday - normal 98.6 today, on period this week too -no known exposures, no known COVID/flu/strep exposures, she has been home -no vomiting, diarrhea, body aches, hemoptysis, wheezing, sob  ROS: See pertinent positives and negatives per HPI.  Past Medical History:  Diagnosis Date  . Anxiety    on zyprexa in the past - stopped medication in 2015  . Blood in stool   . Chicken pox   . Gestational diabetes    gestational diabetes  . Heart murmur    and palpitations, s/p eval with cardiology in 2014  . Hypoglycemia    has seen endocrinologist  . Urinary tract infection     Past Surgical History:  Procedure Laterality Date  . CESAREAN SECTION     [id number]    Family History  Problem Relation Age of Onset  . Healthy Mother   . Healthy Father   . Diabetes Paternal Grandmother   . Breast cancer Paternal Aunt   . Breast cancer Maternal Grandmother     SOCIAL HX: see hpi   Current Outpatient Medications:  .  Blood Glucose Monitoring Suppl (FREESTYLE LITE) DEVI, Use as directed to check blood sugar once a day, Disp: 1 each, Rfl: 0 .  glucose blood (FREESTYLE LITE) test strip, Use as instructed once a day, Disp: 100 each, Rfl: 1 .  Lancets (FREESTYLE) lancets, Use as instructed to check blood  sugar, Disp: 100 each, Rfl: 1 .  cetirizine (ZYRTEC) 10 MG tablet, Take 1 tablet (10 mg total) by mouth daily., Disp: 30 tablet, Rfl: 1 .  fluticasone (FLONASE) 50 MCG/ACT nasal spray, Place 2 sprays into both nostrils daily., Disp: 16 g, Rfl: 0  EXAM:  VITALS per patient if applicable: T 36.6  GENERAL: alert, oriented, appears well and in no acute distress  HEENT: atraumatic, conjunttiva clear, no obvious abnormalities on inspection of external nose and ears, allergic shiners, no gross rhinorhea  NECK: normal movements of the head and neck, supple  LUNGS: on inspection no signs of respiratory distress, breathing rate appears normal, no obvious gross SOB, gasping or wheezing  CV: no obvious cyanosis  MS: moves all visible extremities without noticeable abnormality  PSYCH/NEURO: pleasant and cooperative, no obvious depression or anxiety, speech and thought processing grossly intact  ASSESSMENT AND PLAN:  Discussed the following assessment and plan:  Rhinitis, unspecified type  -we discussed possible serious and likely etiologies, workup and treatment, treatment risks and return precautions; suspect allergic rhinitis most likely, vs other. COVID19 discussed, though felt to be less likely given isolated/ -after this discussion, [name] opted for trial antihistamine and flonase, prn tylenol if needed -follow up advised in 3-5 days, she opted to call if needed if worsening or not improving   I discussed the assessment and treatment plan with the patient. The patient was provided an opportunity to ask questions and all  were answered. The patient agreed with the plan and demonstrated an understanding of the instructions.   The patient was advised to call back or seek an in-person evaluation if the symptoms worsen or if the condition fails to improve as anticipated.   Follow up instructions: Advised assistant [name] to help patient arrange the following: -TOC with Dr. Ethlyn Gallery in 3  months  Lucretia Kern, DO

## 2018-12-24 ENCOUNTER — Encounter: Payer: Self-pay | Admitting: Family Medicine

## 2019-04-08 ENCOUNTER — Encounter: Payer: No Typology Code available for payment source | Admitting: Family Medicine

## 2019-04-11 ENCOUNTER — Other Ambulatory Visit: Payer: Self-pay

## 2019-04-11 DIAGNOSIS — Z20822 Contact with and (suspected) exposure to covid-19: Secondary | ICD-10-CM

## 2019-04-13 LAB — NOVEL CORONAVIRUS, NAA: SARS-CoV-2, NAA: NOT DETECTED

## 2019-04-13 LAB — SPECIMEN STATUS REPORT

## 2019-06-03 ENCOUNTER — Encounter: Payer: Self-pay | Admitting: Family Medicine

## 2019-06-04 MED FILL — FLUARIX QUADRIVALENT 0.5 ML: 0.5 | 1 days supply | Qty: 1 | Fill #0

## 2019-07-04 ENCOUNTER — Encounter: Payer: No Typology Code available for payment source | Admitting: Family Medicine

## 2019-09-06 ENCOUNTER — Telehealth: Payer: Self-pay

## 2019-09-06 NOTE — Telephone Encounter (Signed)
Called pt. We had a cancellation for tomorrow. Pt scheduled with Amy NP for 10:30 AM. Pt denied any new neurological deficits just stated she has had a headache on the same side as where she had her migraines and she wants to make sure nothing is wrong. Headache comes and goes. March appt with Dr. Jaynee Eagles canceled since pt seen sooner.

## 2019-09-06 NOTE — Telephone Encounter (Signed)
No sooner appointment available with NP or MD. Patient requested a CB to further discuss needing to be seen in person. Please follow up

## 2019-09-06 NOTE — Telephone Encounter (Signed)
Patient called stating she has been having more side headaches instead of full migraines like before and wasn't sure what could be causing this.   Please follow up

## 2019-09-07 ENCOUNTER — Encounter: Payer: Self-pay | Admitting: Family Medicine

## 2019-09-07 ENCOUNTER — Telehealth: Payer: Self-pay | Admitting: Interventional Cardiology

## 2019-09-07 ENCOUNTER — Ambulatory Visit (INDEPENDENT_AMBULATORY_CARE_PROVIDER_SITE_OTHER): Payer: No Typology Code available for payment source | Admitting: Family Medicine

## 2019-09-07 ENCOUNTER — Other Ambulatory Visit: Payer: Self-pay

## 2019-09-07 VITALS — BP 122/88 | HR 90 | Temp 97.2°F | Ht 62.0 in | Wt 143.0 lb

## 2019-09-07 DIAGNOSIS — I499 Cardiac arrhythmia, unspecified: Secondary | ICD-10-CM | POA: Diagnosis not present

## 2019-09-07 DIAGNOSIS — G43009 Migraine without aura, not intractable, without status migrainosus: Secondary | ICD-10-CM

## 2019-09-07 MED ORDER — RIZATRIPTAN BENZOATE 10 MG PO TBDP: 10.0000 mg | ORAL_TABLET | ORAL | 11 refills | Status: DC | PRN

## 2019-09-07 MED ORDER — PROPRANOLOL HCL 20 MG PO TABS: 20.0000 mg | ORAL_TABLET | Freq: Two times a day (BID) | ORAL | 5 refills | Status: DC

## 2019-09-07 MED FILL — RIZATRIPTAN 10 MG ODT: 10 | 30 days supply | Qty: 9 | Fill #0

## 2019-09-07 MED FILL — PROPRANOLOL 20 MG TABLET: 20 | 30 days supply | Qty: 60 | Fill #0

## 2019-09-07 NOTE — Telephone Encounter (Signed)
Copied from Neurology note today:  Please call cardiology regarding irregular heart rhythm. If they are ok with our plan, we will start propranolol 20mg  twice daily. (take 20mg  daily for 1-2 weeks then increase to twice daily dosing.) Monitor heart rate and pulse.   Rizatriptan for abortive therapy (1 tablet at onset of headache, may repeat 1 tablet in 2 hours but no more than 2 in 24 hours or 9 per month)  Avoid regular use of Naproxen and Tylenol.   Follow up in 6 months      Will route to Dr. Tamala Julian for review and advisement.  Pt would like to know if Dr. Tamala Julian feels it is ok to start Propranolol.

## 2019-09-07 NOTE — Telephone Encounter (Signed)
New Message:      Pt wants to know if it will be alright for her to take Propranolol, her other doctor is prescribing this.

## 2019-09-07 NOTE — Telephone Encounter (Signed)
It is okay to use propranolol

## 2019-09-07 NOTE — Patient Instructions (Addendum)
Please call cardiology regarding irregular heart rhythm. If they are ok with our plan, we will start propranolol 20mg  twice daily. (take 20mg  daily for 1-2 weeks then increase to twice daily dosing.) Monitor heart rate and pulse.   Rizatriptan for abortive therapy (1 tablet at onset of headache, may repeat 1 tablet in 2 hours but no more than 2 in 24 hours or 9 per month)  Avoid regular use of Naproxen and Tylenol.   Follow up in 6 months     Propranolol Extended-Release Capsules What is this medicine? PROPRANOLOL (proe PRAN oh lole) is a beta blocker. It decreases the amount of work your heart has to do and helps your heart beat regularly. It treats high blood pressure and/or prevent chest pain (also called angina). This medicine may be used for other purposes; ask your health care provider or pharmacist if you have questions. COMMON BRAND NAME(S): Inderal LA, Inderal XL, InnoPran XL What should I tell my health care provider before I take this medicine? They need to know if you have any of these conditions:  circulation problems, or blood vessel disease  diabetes  history of heart attack or heart disease, vasospastic angina  kidney disease  liver disease  lung or breathing disease, like asthma or emphysema  pheochromocytoma  slow heart rate  thyroid disease  an unusual or allergic reaction to propranolol, other beta-blockers, medicines, foods, dyes, or preservatives  pregnant or trying to get pregnant  breast-feeding How should I use this medicine? Take this drug by mouth. Take it as directed on the prescription label at the same time every day. Do not cut, crush or chew this drug. Swallow the capsules whole. You can take it with or without food. If it upsets your stomach, take it with food. Keep taking it unless your health care provider tells you to stop. Talk to your health care provider about the use of this drug in children. Special care may be needed. Overdosage:  If you think you have taken too much of this medicine contact a poison control center or emergency room at once. NOTE: This medicine is only for you. Do not share this medicine with others. What if I miss a dose? If you miss a dose, take it as soon as you can. If it is almost time for your next dose, take only that dose. Do not take double or extra doses. What may interact with this medicine? Do not take this medicine with any of the following medications:  feverfew  phenothiazines like chlorpromazine, mesoridazine, prochlorperazine, thioridazine This medicine may also interact with the following medications:  aluminum hydroxide gel  antipyrine  antiviral medicines for HIV or AIDS  barbiturates like phenobarbital  certain medicines for blood pressure, heart disease, irregular heart beat  cimetidine  ciprofloxacin  diazepam  fluconazole  haloperidol  isoniazid  medicines for cholesterol like cholestyramine or colestipol  medicines for mental depression  medicines for migraine headache like almotriptan, eletriptan, frovatriptan, naratriptan, rizatriptan, sumatriptan, zolmitriptan  NSAIDs, medicines for pain and inflammation, like ibuprofen or naproxen  phenytoin  rifampin  teniposide  theophylline  thyroid medicines  tolbutamide  warfarin  zileuton This list may not describe all possible interactions. Give your health care provider a list of all the medicines, herbs, non-prescription drugs, or dietary supplements you use. Also tell them if you smoke, drink alcohol, or use illegal drugs. Some items may interact with your medicine. What should I watch for while using this medicine? Visit your doctor or  health care professional for regular check ups. Contact your doctor right away if your symptoms worsen. Check your blood pressure and pulse rate regularly. Ask your health care professional what your blood pressure and pulse rate should be, and when you should  contact them. Do not stop taking this medicine suddenly. This could lead to serious heart-related effects. You may get drowsy or dizzy. Do not drive, use machinery, or do anything that needs mental alertness until you know how this drug affects you. Do not stand or sit up quickly, especially if you are an older patient. This reduces the risk of dizzy or fainting spells. Alcohol can make you more drowsy and dizzy. Avoid alcoholic drinks. This medicine may increase blood sugar. Ask your healthcare provider if changes in diet or medicines are needed if you have diabetes. Do not treat yourself for coughs, colds, or pain while you are taking this medicine without asking your doctor or health care professional for advice. Some ingredients may increase your blood pressure. What side effects may I notice from receiving this medicine? Side effects that you should report to your doctor or health care professional as soon as possible:  allergic reactions like skin rash, itching or hives, swelling of the face, lips, or tongue  breathing problems  cold hands or feet  difficulty sleeping, nightmares  dry peeling skin  hallucinations  muscle cramps or weakness   signs and symptoms of high blood sugar such as being more thirsty or hungry or having to urinate more than normal. You may also feel very tired or have blurry vision.  slow heart rate  swelling of the legs and ankles  vomiting Side effects that usually do not require medical attention (report to your doctor or health care professional if they continue or are bothersome):  change in sex drive or performance  diarrhea  dry sore eyes  hair loss  nausea  weak or tired This list may not describe all possible side effects. Call your doctor for medical advice about side effects. You may report side effects to FDA at 1-800-FDA-1088. Where should I keep my medicine? Keep out of the reach of children and pets. Store at room temperature  between 15 and 30 degrees C (59 and 86 degrees F). Protect from light and moisture. Keep the container tightly closed. Avoid exposure to extreme heat. Do not freeze. Throw away any unused drug after the expiration date. NOTE: This sheet is a summary. It may not cover all possible information. If you have questions about this medicine, talk to your doctor, pharmacist, or health care provider.  2020 [employer] Standard (2019-03-21 16:23:26)

## 2019-09-07 NOTE — Telephone Encounter (Signed)
Left detailed message on pt's VM, DPR on file, stating ok to use Propranolol.  Advised on message to keep appt for later this month.  Advised to call back if any questions.

## 2019-09-07 NOTE — Progress Notes (Addendum)
PATIENT: [name] DOB: 08-25-1981  REASON FOR VISIT: follow up HISTORY FROM: patient  Chief Complaint  Patient presents with  . Follow-up    NewRM. Alone. Sometimes has dizziness and loss of balance. Headaches mostly on the right side. Used to have pains behing the eye. Then behind the ear.     HISTORY OF PRESENT ILLNESS: Today 09/07/19 [name] is a 39 y.o. female here today for follow up. She was seen by Dr Jaynee Eagles in 12/2017. She tried taking naproxen before menstrual cycle but does not feel that this has helped. She usually takes naproxen and Tylenol regularly for at least 2 weeks of the month. She had a migraine that lasted about a week recently. Pain is usually right sided. Pounding. Can have pain behind right eye, can be dull ache of her whole right side. She does not usually have light or sound sensitivity. Some nausea when it is bad. Cool compress does help.  She does report occasional dizziness with quick turns of her head.  She has had some right ear pain recently.  She does have a history of seasonal allergies.  She is not currently taking any antihistamines or nasal steroids.  MRI was unremarkable in 12/2017 with exception of benign cyst and chronic sinusitis.    She was last seen by Dr Tamala Julian, cardiology, last year for concerns of arrhythmia.  HISTORY: (copied from Dr Cathren Laine note on 12/24/2017)  HPI:  [name] is a 39 y.o. female here as a referral from Dr. Maudie Mercury for migraines.  No significant past medical history mild anxiety. She has migraines a few days before her period or a few days afterwards they seem to cluster. May happen other times of the month. Pounding in the forehead, movement makes it worse, she can wake up with the headaches. Worse also in the evening. Some nausea. She is very regular. Naproxen helps and extra strength tylenol help. Can last 4-24 hours untreated. No prodrome. Can have some "hangover". Alcohol is a trigger. 5-6 migraine days a month. Dad has  headaches. She has episodes of dizziness. No vision loss, no sensory symptoms. The dizziness/veertigo is worsening. No other focal neurologic deficits, associated symptoms, inciting events or modifiable factors.  Reviewed notes, labs and imaging from outside physicians, which showed:  TSH normal  Reviewed referring physician notes.  Patient's annual exam, reviewed, normal, no significant past medical history.  Regular stable menstrual pattern.  Patient has a history of migraines.  Headaches occur when she has her periods sometimes occur at other times.  She is taken naproxen or acetaminophen extra strength which generally seems to knock it out.   REVIEW OF SYSTEMS: Out of a complete 14 system review of symptoms, the patient complains only of the following symptoms, headaches and all other reviewed systems are negative.  ALLERGIES: No Known Allergies  HOME MEDICATIONS: Outpatient Medications Prior to Visit  Medication Sig Dispense Refill  . cetirizine (ZYRTEC) 10 MG tablet Take 1 tablet (10 mg total) by mouth daily. (Patient not taking: Reported on 09/07/2019) 30 tablet 1  . fluticasone (FLONASE) 50 MCG/ACT nasal spray Place 2 sprays into both nostrils daily. (Patient not taking: Reported on 09/07/2019) 16 g 0  . Blood Glucose Monitoring Suppl (FREESTYLE LITE) DEVI Use as directed to check blood sugar once a day (Patient not taking: Reported on 09/07/2019) 1 each 0  . glucose blood (FREESTYLE LITE) test strip Use as instructed once a day (Patient not taking: Reported on  09/07/2019) 100 each 1  . Lancets (FREESTYLE) lancets Use as instructed to check blood sugar (Patient not taking: Reported on 09/07/2019) 100 each 1   No facility-administered medications prior to visit.    PAST MEDICAL HISTORY: Past Medical History:  Diagnosis Date  . Anxiety    on zyprexa in the past - stopped medication in 2015  . Blood in stool   . Chicken pox   . Gestational diabetes    gestational diabetes  .  Heart murmur    and palpitations, s/p eval with cardiology in 2014  . Hypoglycemia    has seen endocrinologist  . Urinary tract infection     PAST SURGICAL HISTORY: Past Surgical History:  Procedure Laterality Date  . CESAREAN SECTION     2009,2012    FAMILY HISTORY: Family History  Problem Relation Age of Onset  . Healthy Mother   . Healthy Father   . Diabetes Paternal Grandmother   . Breast cancer Paternal Aunt   . Breast cancer Maternal Grandmother     SOCIAL HISTORY: Social History   Socioeconomic History  . Marital status: Married    Spouse name: Not on file  . Number of children: 2  . Years of education: 55  . Highest education level: Professional school degree (e.g., MD, DDS, DVM, JD)  Occupational History  . Occupation: audiologist  Tobacco Use  . Smoking status: Never Smoker  . Smokeless tobacco: Never Used  Substance and Sexual Activity  . Alcohol use: Yes    Comment: occasional  . Drug use: No  . Sexual activity: Not on file  Other Topics Concern  . Not on file  Social History Narrative   Work or School: [employer] Situation: Lives with two children and husband      Spiritual Beliefs: Catholic      Lifestyle: no regular exercise; diet is good      Caffeine: 1 cup of coffee & 1 green tea daily   Social Determinants of Health   Financial Resource Strain:   . Difficulty of Paying Living Expenses: Not on file  Food Insecurity:   . Worried About Charity fundraiser in the Last Year: Not on file  . Ran Out of Food in the Last Year: Not on file  Transportation Needs:   . Lack of Transportation (Medical): Not on file  . Lack of Transportation (Non-Medical): Not on file  Physical Activity:   . Days of Exercise per Week: Not on file  . Minutes of Exercise per Session: Not on file  Stress:   . Feeling of Stress : Not on file  Social Connections:   . Frequency of Communication with Friends and Family: Not on file  . Frequency of  Social Gatherings with Friends and Family: Not on file  . Attends Religious Services: Not on file  . Active Member of Clubs or Organizations: Not on file  . Attends Archivist Meetings: Not on file  . Marital Status: Not on file  Intimate Partner Violence:   . Fear of Current or Ex-Partner: Not on file  . Emotionally Abused: Not on file  . Physically Abused: Not on file  . Sexually Abused: Not on file      PHYSICAL EXAM  Vitals:   09/07/19 1022  BP: 122/88  Pulse: 90  Temp: (!) 97.2 F (36.2 C)  Weight: 143 lb (64.9 kg)  Height: 5\' 2"  (1.575 m)   Body mass index is  26.16 kg/m.  Generalized: Well developed, in no acute distress  Cardiology: irregular rhythm noted, no murmur noted Respiratory: Clear to auscultation bilaterally Neurological examination  Mentation: Alert oriented to time, place, history taking. Follows all commands speech and language fluent Cranial nerve II-XII: Pupils were equal round reactive to light. Extraocular movements were full, visual field were full on confrontational test. Facial sensation and strength were normal. Uvula tongue midline. Head turning and shoulder shrug  were normal and symmetric. Motor: The motor testing reveals 5 over 5 strength of all 4 extremities. Good symmetric motor tone is noted throughout.  Sensory: Sensory testing is intact to soft touch on all 4 extremities. No evidence of extinction is noted.  Coordination: Cerebellar testing reveals good finger-nose-finger and heel-to-shin bilaterally.  Gait and station: Gait is normal. Romberg is negative. No drift is seen.    DIAGNOSTIC DATA (LABS, IMAGING, TESTING) - I reviewed patient records, labs, notes, testing and imaging myself where available.  No flowsheet data found.   Lab Results  Component Value Date   WBC 6.8 06/16/2017   HGB 12.9 06/16/2017   HCT 38.1 06/16/2017   MCV 88.5 06/16/2017   PLT 276.0 06/16/2017      Component Value Date/Time   NA 138  06/16/2017 1647   K 3.8 06/16/2017 1647   CL 103 06/16/2017 1647   CO2 25 06/16/2017 1647   GLUCOSE 93 06/16/2017 1647   BUN 10 06/16/2017 1647   CREATININE 0.64 06/16/2017 1647   CALCIUM 9.6 06/16/2017 1647   GFRNONAA >60 02/17/2011 1005   GFRAA >60 02/17/2011 1005   Lab Results  Component Value Date   CHOL 168 07/29/2018   HDL 79.70 07/29/2018   LDLCALC 79 07/29/2018   TRIG 48.0 07/29/2018   CHOLHDL 2 07/29/2018   Lab Results  Component Value Date   HGBA1C 5.3 06/16/2017   No results found for: VITAMINB12 Lab Results  Component Value Date   TSH 2.35 06/16/2017       ASSESSMENT AND PLAN 39 y.o. year old female  has a past medical history of Anxiety, Blood in stool, Chicken pox, Gestational diabetes, Heart murmur, Hypoglycemia, and Urinary tract infection. here with     ICD-10-CM   1. Migraine without aura and without status migrainosus, not intractable  G43.009   2. Irregular heart rhythm  I49.9     Syanne has noticed worsening of migraine headaches, specifically around timing of menstrual cycle.  She now feels that she is having to treat a migraine at least 2 weeks out of the month.  We have discussed options for preventative therapy.  We have discussed topiramate, propranolol, amitriptyline and nortriptyline.  She is most comfortable starting propranolol.  Medication initially called into her pharmacy, however, upon physical examination and irregular heart rhythm is noted.  Extra heart sounds are noted with auscultation.  I am uncertain of the clinical relevance regarding irregular rhythm.  She does report previous work-up with Dr. Tamala Julian, cardiology, and was noted to have an arrhythmia.  She does not have any further details.  There is no information to review in epic.  I have asked that she reach out to cardiology for further guidance regarding her stay.  I will also send notes today.  If cleared by cardiology she may start propranolol as prescribed.  She may use rizatriptan  as needed for abortive therapy.  I have advised against the regular use of over-the-counter analgesics due to rebound headaches.  She will also follow-up closely with her  primary care for any concerns of sinusitis or seasonal allergies.  She may benefit from regular use of antihistamine or nasal steroid.  She will call me as soon as possible with an update from cardiology.  We will update a plan as needed pending their response.  She will follow-up in the office in 6 months, sooner if needed.  She verbalizes understanding and agreement with this plan.   No orders of the defined types were placed in this encounter.    Meds ordered this encounter  Medications  . propranolol (INDERAL) 20 MG tablet    Sig: Take 1 tablet (20 mg total) by mouth 2 (two) times daily.    Dispense:  60 tablet    Refill:  5    Order Specific Question:   Supervising Provider    Answer:   Melvenia Beam [id number]  . rizatriptan (MAXALT-MLT) 10 MG disintegrating tablet    Sig: Take 1 tablet (10 mg total) by mouth as needed for migraine. May repeat in 2 hours if needed    Dispense:  9 tablet    Refill:  11    Order Specific Question:   Supervising Provider    Answer:   Melvenia Beam [id number], FNP-C 09/07/2019, 4:06 PM [facility] [address], [place], [place] [address] [phone]  Made any corrections needed, and agree with history, physical, neuro exam,assessment and plan as stated.     Sarina Ill, MD [facility]

## 2019-09-08 ENCOUNTER — Other Ambulatory Visit: Payer: Self-pay

## 2019-09-08 ENCOUNTER — Telehealth (INDEPENDENT_AMBULATORY_CARE_PROVIDER_SITE_OTHER): Payer: No Typology Code available for payment source | Admitting: Family Medicine

## 2019-09-08 ENCOUNTER — Encounter: Payer: Self-pay | Admitting: Family Medicine

## 2019-09-08 DIAGNOSIS — J302 Other seasonal allergic rhinitis: Secondary | ICD-10-CM

## 2019-09-08 DIAGNOSIS — R519 Headache, unspecified: Secondary | ICD-10-CM | POA: Diagnosis not present

## 2019-09-08 DIAGNOSIS — Z7189 Other specified counseling: Secondary | ICD-10-CM

## 2019-09-08 NOTE — Progress Notes (Signed)
Virtual Visit via Video Note  I connected with [name]  on 09/08/19 at  5:00 PM EST by a video enabled telemedicine application and verified that I am speaking with the correct person using two identifiers.  Location patient: home Location provider:work or home office Persons participating in the virtual visit: patient, provider  I discussed the limitations of evaluation and management by telemedicine and the availability of in person appointments. The patient expressed understanding and agreed to proceed.   HPI:  Acute visit for Allergic Rhinitis: -chronic allergies and sinus issues -symptoms: clear nasal congestion every morning, some sneezing, headaches triggered by sinus issues, this seems to be year round -seeing neurology for her headaches, has had imaging, they think allergies are contributing - she is on low dose propranolol along with prn abortive -she has taken zyrtec and flonase in the past -has some sleep issues -prefers to do things without as much meds if possible and asks about vitamins as well  ROS: See pertinent positives and negatives per HPI.  Past Medical History:  Diagnosis Date  . Anxiety    on zyprexa in the past - stopped medication in 2015  . Blood in stool   . Chicken pox   . Gestational diabetes    gestational diabetes  . Heart murmur    and palpitations, s/p eval with cardiology in 2014  . Hypoglycemia    has seen endocrinologist  . Urinary tract infection     Past Surgical History:  Procedure Laterality Date  . CESAREAN SECTION     [id number]    Family History  Problem Relation Age of Onset  . Healthy Mother   . Healthy Father   . Diabetes Paternal Grandmother   . Breast cancer Paternal Aunt   . Breast cancer Maternal Grandmother     SOCIAL HX: see hpi   Current Outpatient Medications:  .  propranolol (INDERAL) 20 MG tablet, Take 1 tablet (20 mg total) by mouth 2 (two) times daily., Disp: 60 tablet, Rfl: 5 .  rizatriptan (MAXALT-MLT)  10 MG disintegrating tablet, Take 1 tablet (10 mg total) by mouth as needed for migraine. May repeat in 2 hours if needed, Disp: 9 tablet, Rfl: 11 .  cetirizine (ZYRTEC) 10 MG tablet, Take 1 tablet (10 mg total) by mouth daily. (Patient not taking: Reported on 09/07/2019), Disp: 30 tablet, Rfl: 1 .  fluticasone (FLONASE) 50 MCG/ACT nasal spray, Place 2 sprays into both nostrils daily. (Patient not taking: Reported on 09/07/2019), Disp: 16 g, Rfl: 0  EXAM:  VITALS per patient if applicable:  GENERAL: alert, oriented, appears well and in no acute distress  HEENT: atraumatic, conjunttiva clear, no obvious abnormalities on inspection of external nose and ears  NECK: normal movements of the head and neck  LUNGS: on inspection no signs of respiratory distress, breathing rate appears normal, no obvious gross SOB, gasping or wheezing  CV: no obvious cyanosis  MS: moves all visible extremities without noticeable abnormality  PSYCH/NEURO: pleasant and cooperative, no obvious depression or anxiety, speech and thought processing grossly intact  ASSESSMENT AND PLAN:  Discussed the following assessment and plan:  Seasonal allergic rhinitis, unspecified trigger -discussed options -she is going to try zyrtec 5-10mg  daily depending on how drowsy it makes her -flonase 2 sprays each nostril daily for 1 month, then 1 spray each nostril daily -consider singulair, follow up if not better with above -decreased dairy consumption  Frequent headaches -seeing neurologist -also suggested limiting frequent use of OTC analgesics, topical menthol,  regular exercise for good posture, healthy diet -could consider magnesium  Other specified counseling (supplements) -vit D3 1000 IU dialy during fall/winter/spring -Vit C up and up lozenges, 1-2 daily  Needs PCP, she plans to call to schedule CPE, prefers to see Dr. Ethlyn Gallery   I discussed the assessment and treatment plan with the patient. The patient was  provided an opportunity to ask questions and all were answered. The patient agreed with the plan and demonstrated an understanding of the instructions.   The patient was advised to call back or seek an in-person evaluation if the symptoms worsen or if the condition fails to improve as anticipated.   Lucretia Kern, DO

## 2019-09-08 NOTE — Addendum Note (Signed)
Addended by: Lucretia Kern on: 09/08/2019 05:35 PM   Modules accepted: Level of Service

## 2019-09-19 NOTE — Progress Notes (Signed)
Cardiology Office Note   Date:  09/23/2019   ID:  [name], DOB 1981/02/01, MRN [id number]  PCP:  Lucretia Kern, DO  Cardiologist:  Dr. Tamala Julian    Chief Complaint  Patient presents with  . Palpitations      History of Present Illness: [name] is a 39 y.o. female who presents for palpitations.  Last seen by Dr. Tamala Julian 10/16/17 with hx of palpitations and heart murmur  Previously evaluated in [id number] for a systolic heart murmur.  Also had complaints at that time of vague palpitations.  Back quality of palpitation has resolved but recently she has had brief episodes of heart irregularity.  Very poorly characterized.  Occurs when she is inactive.  It frightens her.  No episodes last longer than several seconds.  No obvious precipitants.  Generally, no cardiac complaints otherwise.  States Dr. Maudie Mercury also was concerned about the murmur.  Prior workup demonstrated a structurally normal heart.  She did have EKG that demonstrated a vertical/right axis deviation but no evidence of right ventricular enlargement or hypertrophy.  Today pt had been seen by Neurology and and she had irregular heart rhythm.  Placed on inderal though she has not yet started.  She feels palpitations in her chest more at rest, none with exertion, does not awaken from sleep.  She has brief momentary episodes of dizziness, though not clear if from palpitations. No syncope (though did have in 2014 for one occurrence)  She drinks 2 cups of caffeine per day, 1 cup of coffee and 1 cup of green tea.  Does not notice increase of palpitations with this.   No chest pain or SOB.  She wants to be more active but has concerns with these irregular heart beats.     Past Medical History:  Diagnosis Date  . Anxiety    on zyprexa in the past - stopped medication in 2015  . Blood in stool   . Chicken pox   . Gestational diabetes    gestational diabetes  . Heart murmur    and palpitations, s/p eval with cardiology in 2014  .  Hypoglycemia    has seen endocrinologist  . Urinary tract infection     Past Surgical History:  Procedure Laterality Date  . CESAREAN SECTION     [id number]     Current Outpatient Medications  Medication Sig Dispense Refill  . cetirizine (ZYRTEC) 10 MG tablet Take 1 tablet (10 mg total) by mouth daily. 30 tablet 1  . fluticasone (FLONASE) 50 MCG/ACT nasal spray Place 2 sprays into both nostrils daily. 16 g 0  . propranolol (INDERAL) 20 MG tablet Take 1 tablet (20 mg total) by mouth 2 (two) times daily. 60 tablet 5  . rizatriptan (MAXALT-MLT) 10 MG disintegrating tablet Take 1 tablet (10 mg total) by mouth as needed for migraine. May repeat in 2 hours if needed 9 tablet 11   No current facility-administered medications for this visit.    Allergies:   Patient has no known allergies.    Social History:  The patient  reports that she has never smoked. She has never used smokeless tobacco. She reports current alcohol use. She reports that she does not use drugs.   Family History:  The patient's family history includes Breast cancer in her maternal grandmother and paternal aunt; Diabetes in her paternal grandmother; Healthy in her father and mother.    ROS:  General:no colds or fevers, no weight changes Skin:no rashes or ulcers  HEENT:no blurred vision, no congestion CV:see HPI PUL:see HPI GI:no diarrhea constipation or melena, no indigestion GU:no hematuria, no dysuria MS:no joint pain, no claudication Neuro:no syncope, no lightheadedness Endo:no diabetes, no thyroid disease  Wt Readings from Last 3 Encounters:  09/22/19 136 lb (61.7 kg)  09/07/19 143 lb (64.9 kg)  12/24/17 135 lb (61.2 kg)     PHYSICAL EXAM: VS:  BP 140/78   Pulse 68   Ht 5\' 2"  (1.575 m)   Wt 136 lb (61.7 kg)   LMP 08/26/2019   SpO2 99%   BMI 24.87 kg/m  , BMI Body mass index is 24.87 kg/m. General:Pleasant affect, NAD Skin:Warm and dry, brisk capillary refill HEENT:normocephalic, sclera clear,  mucus membranes moist Neck:supple, no JVD, no bruits  Heart:S1S2 RRR with soft [id number] systolic murmur and premature beats heard, no gallup, rub or click Lungs:clear without rales, rhonchi, or wheezes [id number], non tender, + BS, do not palpate liver spleen or masses Ext:no lower ext edema, 2+ pedal pulses, 2+ radial pulses Neuro:alert and oriented X3, MAE, follows commands, + facial symmetry    EKG:  EKG is ordered today. The ekg ordered today demonstrates SR at 87, rt ward axis.  Stable normal EKG   Recent Labs: No results found for requested labs within last 8760 hours.    Lipid Panel    Component Value Date/Time   CHOL 168 07/29/2018 0914   TRIG 48.0 07/29/2018 0914   HDL 79.70 07/29/2018 0914   CHOLHDL 2 07/29/2018 0914   VLDL 9.6 07/29/2018 0914   LDLCALC 79 07/29/2018 0914       Other studies Reviewed: Additional studies/ records that were reviewed today include: . Echo 08/15/13  Study Conclusions   Left ventricle: The cavity size was normal. Systolic  function was normal. The estimated ejection fraction was in  the range of 55% to 60%. Wall motion was normal; there were  no regional wall motion abnormalities.   Normal.   ASSESSMENT AND PLAN:  1.  Palpitations- "hard heart beats"  Will have her wear zio patch for 2 weeks and if needed take the inderal PRN, but once monitor back begin inderal per neurology recommendations.  Discussed more of a nuisance  Rhythm.  To exercise especially with monitor to eval rhythm.   Could hear on exam but were not present on EKG.    2.  Migraines.    To begin treatment with inderal for both migraine prevention and palpitation per neurology.   3.  Heart murmur stable.     Current medicines are reviewed with the patient today.  The patient Has no concerns regarding medicines.  The following changes have been made:  See above Labs/ tests ordered today include:see above  Disposition:   FU:  see above  Signed, Cecilie Kicks, NP    09/23/2019 8:24 AM    [address] [place], [place], [place] Phone: [phone]; Fax: [phone]

## 2019-09-21 ENCOUNTER — Telehealth: Payer: Self-pay | Admitting: Family Medicine

## 2019-09-21 ENCOUNTER — Encounter: Payer: Self-pay | Admitting: Family Medicine

## 2019-09-21 DIAGNOSIS — R011 Cardiac murmur, unspecified: Secondary | ICD-10-CM

## 2019-09-21 NOTE — Telephone Encounter (Signed)
Cindy from [facility] called to confirm a referral for pt for cardiology. Transferred call to referrals Neoma Laming)

## 2019-09-21 NOTE — Telephone Encounter (Signed)
Pt needs a new referral because the referral she had expired on 06/18/18. Thanks

## 2019-09-22 ENCOUNTER — Encounter: Payer: Self-pay | Admitting: Cardiology

## 2019-09-22 ENCOUNTER — Ambulatory Visit: Payer: No Typology Code available for payment source | Admitting: Cardiology

## 2019-09-22 ENCOUNTER — Encounter: Payer: Self-pay | Admitting: *Deleted

## 2019-09-22 ENCOUNTER — Other Ambulatory Visit: Payer: Self-pay

## 2019-09-22 VITALS — BP 140/78 | HR 68 | Ht 62.0 in | Wt 136.0 lb

## 2019-09-22 DIAGNOSIS — R011 Cardiac murmur, unspecified: Secondary | ICD-10-CM | POA: Diagnosis not present

## 2019-09-22 DIAGNOSIS — R002 Palpitations: Secondary | ICD-10-CM

## 2019-09-22 DIAGNOSIS — G43009 Migraine without aura, not intractable, without status migrainosus: Secondary | ICD-10-CM

## 2019-09-22 NOTE — Patient Instructions (Signed)
Medication Instructions:  Your physician recommends that you continue on your current medications as directed. Please refer to the Current Medication list given to you today.  *If you need a refill on your cardiac medications before your next appointment, please call your pharmacy*  Lab Work: None ordereed  If you have labs (blood work) drawn today and your tests are completely normal, you will receive your results only by: Marland Kitchen MyChart Message (if you have MyChart) OR . A paper copy in the mail If you have any lab test that is abnormal or we need to change your treatment, we will call you to review the results.  Testing/Procedures:  [place]- Long Term Monitor Instructions   Your physician has requested you wear your ZIO patch monitor 14 days.   This is a single patch monitor.  [employer] supplies one patch monitor per enrollment.  Additional stickers are not available.   Please do not apply patch if you will be having a Nuclear Stress Test, Echocardiogram, Cardiac CT, MRI, or Chest Xray during the time frame you would be wearing the monitor. The patch cannot be worn during these tests.  You cannot remove and re-apply the ZIO XT patch monitor.   Your ZIO patch monitor will be sent USPS Priority mail from [facility] directly to your home address. The monitor may also be mailed to a PO BOX if home delivery is not available.   It may take 3-5 days to receive your monitor after you have been enrolled.   Once you have received you monitor, please review enclosed instructions.  Your monitor has already been registered assigning a specific monitor serial # to you.   Applying the monitor   Shave hair from upper left chest.   Hold abrader disc by orange tab.  Rub abrader in 40 strokes over left upper chest as indicated in your monitor instructions.   Clean area with 4 enclosed alcohol pads .  Use all pads to assure are is cleaned thoroughly.  Let dry.   Apply patch as indicated in  monitor instructions.  Patch will be place under collarbone on left side of chest with arrow pointing upward.   Rub patch adhesive wings for 2 minutes.Remove white label marked "1".  Remove white label marked "2".  Rub patch adhesive wings for 2 additional minutes.   While looking in a mirror, press and release button in center of patch.  A small green light will flash 3-4 times .  This will be your only indicator the monitor has been turned on.     Do not shower for the first 24 hours.  You may shower after the first 24 hours.   Press button if you feel a symptom. You will hear a small click.  Record Date, Time and Symptom in the Patient Log Book.   When you are ready to remove patch, follow instructions on last 2 pages of Patient Log Book.  Stick patch monitor onto last page of Patient Log Book.   Place Patient Log Book in [place] box.  Use locking tab on box and tape box closed securely.  The [employer] and [employer] has [employer] on it.  Please place in mailbox as soon as possible.  Your physician should have your test results approximately 7 days after the monitor has been mailed back to [facility].   Call [place] at [phone] if you have questions regarding your ZIO XT patch monitor.  Call them immediately if you  see an orange light blinking on your monitor.   If your monitor falls off in less than 4 days contact our Monitor department at [phone].  If your monitor becomes loose or falls off after 4 days call [employer] at [phone] for suggestions on securing your monitor.   Follow-Up: At [facility], you and your health needs are our priority.  As part of our continuing mission to provide you with exceptional heart care, we have created designated Provider Care Teams.  These Care Teams include your primary Cardiologist (physician) and Advanced Practice Providers (APPs -  Physician Assistants and Nurse Practitioners) who all work together to provide you  with the care you need, when you need it.  Your next appointment:   11/02/2019 ARRIVE AT 8:30  The format for your next appointment:   In Person  Provider:   Cecilie Kicks, NP  Other Instructions

## 2019-09-22 NOTE — Progress Notes (Signed)
Patient ID: [name], female   DOB: Mar 09, 1981, 39 y.o.   MRN: [id number] Patient enrolled for [employer] to mail a 14 day ZIO XT long term holter monitor to her home.

## 2019-09-22 NOTE — Telephone Encounter (Signed)
Referral entered and I left a detailed message at the pts cell number stating this.

## 2019-09-22 NOTE — Telephone Encounter (Signed)
Ok for re-referral to cardiology for murmur for patient. See pt/insurance requests messages. Thanks!

## 2019-09-26 ENCOUNTER — Ambulatory Visit (INDEPENDENT_AMBULATORY_CARE_PROVIDER_SITE_OTHER): Payer: No Typology Code available for payment source

## 2019-09-26 DIAGNOSIS — R002 Palpitations: Secondary | ICD-10-CM | POA: Diagnosis not present

## 2019-09-26 DIAGNOSIS — R011 Cardiac murmur, unspecified: Secondary | ICD-10-CM

## 2019-10-17 ENCOUNTER — Other Ambulatory Visit: Payer: Self-pay | Admitting: Obstetrics and Gynecology

## 2019-10-17 DIAGNOSIS — Z1231 Encounter for screening mammogram for malignant neoplasm of breast: Secondary | ICD-10-CM

## 2019-10-26 ENCOUNTER — Telehealth: Payer: Self-pay | Admitting: Family Medicine

## 2019-10-26 DIAGNOSIS — Z01419 Encounter for gynecological examination (general) (routine) without abnormal findings: Secondary | ICD-10-CM

## 2019-10-26 DIAGNOSIS — Z1239 Encounter for other screening for malignant neoplasm of breast: Secondary | ICD-10-CM

## 2019-10-26 NOTE — Telephone Encounter (Addendum)
Pt needs referrals for insurance purposes she already has appointments made at these offices. Pt would like to be contacted once it has been done  [facility] [place] for yearly mammogram  Phone:[phone] Appt: 11/16/19    2-[facility] -regular gynecology  Phone: [phone]  Appt: 12/20/19   Pt can be reached at [phone]

## 2019-10-26 NOTE — Telephone Encounter (Signed)
Ok to refer. Thanks

## 2019-10-27 NOTE — Telephone Encounter (Signed)
I left a detailed message at the pts cell number stating the referrals were entered.

## 2019-11-01 ENCOUNTER — Ambulatory Visit: Payer: No Typology Code available for payment source | Admitting: Neurology

## 2019-11-01 NOTE — Progress Notes (Signed)
Cardiology Office Note   Date:  11/02/2019   ID:  [name], DOB 07-27-1981, MRN [id number]  PCP:  Claire Kern, DO  Cardiologist:  Dr. Tamala Julian    Chief Complaint  Patient presents with  . Palpitations      History of Present Illness: [name] is a 39 y.o. female who presents for palpitations.   Last seen by Dr. Tamala Julian 10/16/17 with hx of palpitations and heart murmur  Previously evaluated in [id number] for a systolic heart murmur. Also had complaints at that time of vague palpitations. Back quality of palpitation has resolved but recently she has had brief episodes of heart irregularity. Very poorly characterized. Occurs when she is inactive. It frightens her. No episodes last longer than several seconds. No obvious precipitants. Generally, no cardiac complaints otherwise.  States Dr. Maudie Mercury also was concerned about the murmur. Prior workup demonstrated a structurally normal heart. She did have EKG that demonstrated a vertical/right axis deviation but no evidence of right ventricular enlargement or hypertrophy.  1 pt had been seen by Neurology and and she had irregular heart rhythm.  Placed on inderal though she has not yet started.  She feels palpitations in her chest more at rest, none with exertion, does not awaken from sleep.  She has brief momentary episodes of dizziness, though not clear if from palpitations. No syncope (though did have in 2014 for one occurrence)  She drinks 2 cups of caffeine per day, 1 cup of coffee and 1 cup of green tea.  Does not notice increase of palpitations with this.   No chest pain or SOB.  She wants to be more active but has concerns with these irregular heart beats.    She was to begin inderal once monitor complete.  Monitor not yet read, preliminary + PSVT,  SB at 0400 ? Sleep apnea and some ventricular rhythm at 105, slower VT.   Today has not yet started BB, most of her symptoms occurred after lunch and she had PVCs at that time causing her  to feel like slow HR with the pause showed her those strips.   No chest pain no syncope.  We discussed inderal and dosing side effects.   Reviewed with Dr. Tamala Julian.  Her EF is normal on echo will check labs. .     Past Medical History:  Diagnosis Date  . Anxiety    on zyprexa in the past - stopped medication in 2015  . Blood in stool   . Chicken pox   . Gestational diabetes    gestational diabetes  . Heart murmur    and palpitations, s/p eval with cardiology in 2014  . Hypoglycemia    has seen endocrinologist  . Urinary tract infection     Past Surgical History:  Procedure Laterality Date  . CESAREAN SECTION     [id number]     Current Outpatient Medications  Medication Sig Dispense Refill  . cetirizine (ZYRTEC) 10 MG tablet Take 1 tablet (10 mg total) by mouth daily. 30 tablet 1  . fluticasone (FLONASE) 50 MCG/ACT nasal spray Place 2 sprays into both nostrils daily. 16 g 0  . rizatriptan (MAXALT-MLT) 10 MG disintegrating tablet Take 1 tablet (10 mg total) by mouth as needed for migraine. May repeat in 2 hours if needed 9 tablet 11  . propranolol (INDERAL) 20 MG tablet Take 1 tablet (20 mg total) by mouth 2 (two) times daily. (Patient not taking: Reported on 11/02/2019) 60 tablet 5  No current facility-administered medications for this visit.    Allergies:   Patient has no known allergies.    Social History:  The patient  reports that she has never smoked. She has never used smokeless tobacco. She reports current alcohol use. She reports that she does not use drugs.   Family History:  The patient's family history includes Breast cancer in her maternal grandmother and paternal aunt; Diabetes in her paternal grandmother; Healthy in her father and mother.    ROS:  General:no colds or fevers, no weight changes CV:see HPI PUL:see HPI GI:no diarrhea constipation or melena, no indigestion GU:no hematuria, no dysuria Neuro:no syncope, no lightheadedness   Wt Readings from Last  3 Encounters:  11/02/19 139 lb 6.4 oz (63.2 kg)  09/22/19 136 lb (61.7 kg)  09/07/19 143 lb (64.9 kg)     PHYSICAL EXAM: VS:  BP 126/80   Pulse 68   Ht 5\' 2"  (1.575 m)   Wt 139 lb 6.4 oz (63.2 kg)   SpO2 99%   BMI 25.50 kg/m  , BMI Body mass index is 25.5 kg/m. General:Pleasant affect, NAD Skin:Warm and dry, brisk capillary refill Heart:S1S2 RRR with soft systolic murmur, no gallup, rub or click Lungs:clear without rales, rhonchi, or wheezes Neuro:alert and oriented X 3, MAE, follows commands, + facial symmetry    EKG:  EKG is NOT ordered today.    Recent Labs: No results found for requested labs within last 8760 hours.    Lipid Panel    Component Value Date/Time   CHOL 168 07/29/2018 0914   TRIG 48.0 07/29/2018 0914   HDL 79.70 07/29/2018 0914   CHOLHDL 2 07/29/2018 0914   VLDL 9.6 07/29/2018 0914   LDLCALC 79 07/29/2018 0914       Other studies Reviewed: Additional studies/ records that were reviewed today include: 14 day zio See above  .Echo 08/15/13  Study Conclusions   Left ventricle: The cavity size was normal. Systolic  function was normal. The estimated ejection fraction was in  the range of 55% to 60%. Wall motion was normal; there were  no regional wall motion abnormalities.   Normal.   ASSESSMENT AND PLAN:  1.  PSVT short bursts she will add the Inderal 20 BID per neuro instructions - if symptoms persist she will call us-- will check CBC, BMP Mg+ and TSH to see if anemia, thyroid lytes cause of arrhthymias.    2.  ido ventricular rhythm (slow VT) rate 105 with normal echo in past, if symptoms persist despite BB and normal labs will repeat echo   3.  Migraines per Neuro.     Current medicines are reviewed with the patient today.  The patient Has no concerns regarding medicines.  The following changes have been made:  See above Labs/ tests ordered today include:see above  Disposition:   FU:  see above  Signed, Cecilie Kicks, NP    11/02/2019 9:09 AM    [address], [place], [place], [place] Phone: [phone]; Fax: [phone]

## 2019-11-02 ENCOUNTER — Encounter (INDEPENDENT_AMBULATORY_CARE_PROVIDER_SITE_OTHER): Payer: No Typology Code available for payment source | Admitting: Ophthalmology

## 2019-11-02 ENCOUNTER — Other Ambulatory Visit: Payer: Self-pay

## 2019-11-02 ENCOUNTER — Ambulatory Visit: Payer: No Typology Code available for payment source | Admitting: Cardiology

## 2019-11-02 ENCOUNTER — Encounter: Payer: Self-pay | Admitting: Cardiology

## 2019-11-02 VITALS — BP 126/80 | HR 68 | Ht 62.0 in | Wt 139.4 lb

## 2019-11-02 DIAGNOSIS — I472 Ventricular tachycardia, unspecified: Secondary | ICD-10-CM

## 2019-11-02 DIAGNOSIS — G43009 Migraine without aura, not intractable, without status migrainosus: Secondary | ICD-10-CM

## 2019-11-02 DIAGNOSIS — I471 Supraventricular tachycardia: Secondary | ICD-10-CM | POA: Diagnosis not present

## 2019-11-02 LAB — MAGNESIUM: Magnesium: 2 mg/dL (ref 1.6–2.3)

## 2019-11-02 LAB — TSH: TSH: 2.17 u[IU]/mL (ref 0.450–4.500)

## 2019-11-02 LAB — BASIC METABOLIC PANEL
BUN/Creatinine Ratio: 12 (ref 9–23)
BUN: 8 mg/dL (ref 6–20)
CO2: 19 mmol/L — ABNORMAL LOW (ref 20–29)
Calcium: 9.6 mg/dL (ref 8.7–10.2)
Chloride: 103 mmol/L (ref 96–106)
Creatinine, Ser: 0.69 mg/dL (ref 0.57–1.00)
GFR calc Af Amer: 127 mL/min/{1.73_m2} (ref >59)
GFR calc non Af Amer: 110 mL/min/{1.73_m2} (ref >59)
Glucose: 93 mg/dL (ref 65–99)
Potassium: 4.3 mmol/L (ref 3.5–5.2)
Sodium: 138 mmol/L (ref 134–144)

## 2019-11-02 LAB — CBC
Hematocrit: 37.5 % (ref 34.0–46.6)
Hemoglobin: 13 g/dL (ref 11.1–15.9)
MCH: 30.2 pg (ref 26.6–33.0)
MCHC: 34.7 g/dL (ref 31.5–35.7)
MCV: 87 fL (ref 79–97)
Platelets: 240 10*3/uL (ref 150–450)
RBC: 4.31 x10E6/uL (ref 3.77–5.28)
RDW: 12.1 % (ref 11.7–15.4)
WBC: 4.2 10*3/uL (ref 3.4–10.8)

## 2019-11-02 NOTE — Patient Instructions (Addendum)
Medication Instructions:  Your physician recommends that you continue on your current medications as directed. Please refer to the Current Medication list given to you today.\ *If you need a refill on your cardiac medications before your next appointment, please call your pharmacy*   Lab Work: Mag, Tsh, [place]   If you have labs (blood work) drawn today and your tests are completely normal, you will receive your results only by: Marland Kitchen MyChart Message (if you have MyChart) OR . A paper copy in the mail If you have any lab test that is abnormal or we need to change your treatment, we will call you to review the results.   Testing/Procedures: None ordered    Follow-Up: You are scheduled to see Dr. Tamala Julian on 01/25/2020 @ 8:00 AM     Other Instructions We recommend signing up for the patient portal called "MyChart".  Sign up information is provided on this After Visit Summary.  MyChart is used to connect with patients for Virtual Visits (Telemedicine).  Patients are able to view lab/test results, encounter notes, upcoming appointments, etc.  Non-urgent messages can be sent to your provider as well.   To learn more about what you can do with MyChart, go to [email or web address].

## 2019-11-03 ENCOUNTER — Telehealth: Payer: Self-pay | Admitting: Cardiology

## 2019-11-03 DIAGNOSIS — R011 Cardiac murmur, unspecified: Secondary | ICD-10-CM

## 2019-11-03 NOTE — Telephone Encounter (Signed)
   Pt is returning call regarding her lab results.  Please call

## 2019-11-03 NOTE — Telephone Encounter (Signed)
-----   Message from Isaiah Serge, NP sent at 11/02/2019  5:43 PM EST ----- Labs were normal, no anemia, kidney function normal, K+ and Mg+ were normal along with thyroid.  It has been 6 years or so since echo so just to be sure valves are stable lets do echo.  For arrhythmias just to be complete.  Marland Kitchen

## 2019-11-09 ENCOUNTER — Encounter: Payer: Self-pay | Admitting: Family Medicine

## 2019-11-11 ENCOUNTER — Other Ambulatory Visit: Payer: Self-pay

## 2019-11-11 ENCOUNTER — Encounter: Payer: Self-pay | Admitting: Family Medicine

## 2019-11-11 ENCOUNTER — Ambulatory Visit (INDEPENDENT_AMBULATORY_CARE_PROVIDER_SITE_OTHER): Payer: No Typology Code available for payment source | Admitting: Family Medicine

## 2019-11-11 DIAGNOSIS — R002 Palpitations: Secondary | ICD-10-CM

## 2019-11-11 DIAGNOSIS — G43009 Migraine without aura, not intractable, without status migrainosus: Secondary | ICD-10-CM | POA: Diagnosis not present

## 2019-11-11 DIAGNOSIS — Z8632 Personal history of gestational diabetes: Secondary | ICD-10-CM | POA: Diagnosis not present

## 2019-11-11 NOTE — Progress Notes (Signed)
Virtual Visit via Video Note  I connected with [name]   on 11/11/19 at 11:00 AM EDT by a video enabled telemedicine application and verified that I am speaking with the correct person using two identifiers.  Location patient: home Location provider:work office Persons participating in the virtual visit: patient, provider  I discussed the limitations of evaluation and management by telemedicine and the availability of in person appointments. The patient expressed understanding and agreed to proceed.   [name] DOB: [birth year] Encounter date: 11/11/2019  This is a 39 y.o. female who presents to establish care. Chief Complaint  Patient presents with  . 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   No specific concerns today.   Migraine: saw neurology for follow up with these; working on preventative med for these now. Just started propranolol last week and then has maxalt for prn migraine. She states that migraines aren't terrible - just slow her down, but doesn't have to sit in dark room. Seem to focus around her period. Has been worse in last few years.   Also had abnormal heart beat; following with cardiology for this. Wore monitor for a couple of weeks in feb. Has planned repeat echo.  Gestational DM: has issues with low blood sugar. Takes gatorade with her jut in case of developing hypoglycemia.   Anxiety better than it used to be; off meds now.  Is taking flonase and zyrtec just to make sure no allergy sx contributing to migraine.    Past Medical History:  Diagnosis Date  . Anxiety    on zyprexa in the past - stopped medication in 2015  . Blood in stool   . Chicken pox   . Gestational diabetes    gestational diabetes  . Heart murmur    and palpitations, s/p eval with cardiology in 2014  . Hypoglycemia    has seen endocrinologist  . Urinary tract infection    Past Surgical History:  Procedure Laterality Date  .  CESAREAN SECTION     [phone]   No Known Allergies Current Meds  Medication Sig  . cetirizine (ZYRTEC) 10 MG tablet Take 1 tablet (10 mg total) by mouth daily.  . fluticasone (FLONASE) 50 MCG/ACT nasal spray Place 2 sprays into both nostrils daily. (Patient taking differently: Place 1 spray into both nostrils daily. )  . propranolol (INDERAL) 20 MG tablet Take 1 tablet (20 mg total) by mouth 2 (two) times daily.  . rizatriptan (MAXALT-MLT) 10 MG disintegrating tablet Take 1 tablet (10 mg total) by mouth as needed for migraine. May repeat in 2 hours if needed   Social History   Tobacco Use  . Smoking status: Never Smoker  . Smokeless tobacco: Never Used  Substance Use Topics  . Alcohol use: Yes    Comment: occasional   Family History  Problem Relation Age of Onset  . Healthy Mother   . Healthy Father   . Diabetes Paternal Grandmother   . Breast cancer Paternal Aunt   . Breast cancer Maternal Grandmother      Review of Systems  Constitutional: Negative for chills, fatigue and fever.  Respiratory: Negative for cough, chest tightness, shortness of breath and wheezing.   Cardiovascular: Positive for palpitations (have been better lately; see hpi). Negative for chest pain and leg swelling.    Objective:  LMP 10/17/2019 (Approximate)       BP Readings from Last 3 Encounters:  11/02/19 126/80  09/22/19 140/78  09/07/19 122/88   Wt Readings from Last 3 Encounters:  11/02/19 139 lb 6.4 oz (63.2 kg)  09/22/19 136 lb (61.7 kg)  09/07/19 143 lb (64.9 kg)    EXAM:  GENERAL: alert, oriented, appears well and in no acute distress  HEENT: atraumatic, conjunctiva clear, no obvious abnormalities on inspection of external nose and ears  NECK: normal movements of the head and neck  LUNGS: on inspection no signs of respiratory distress, breathing rate appears normal, no obvious gross SOB, gasping or wheezing  CV: no obvious cyanosis  MS: moves all visible extremities without  noticeable abnormality  PSYCH/NEURO: pleasant and cooperative, no obvious depression or anxiety, speech and thought processing grossly intact  SKIN: no facial or neck abnormalities.   Assessment/Plan  1. Migraine without aura and without status migrainosus, not intractable Following with neurology; controlled.   2. Hx gestational diabetes Will monitor blood sugar regularly. Normal on recent bloodwork (reviewed today).   3. Palpitations Intermittent. Following with cardiology. Has echo coming up.   Follows with gyn regularly. Follows with derm yearly. Has mammogram scheduled.      I discussed the assessment and treatment plan with the patient. The patient was provided an opportunity to ask questions and all were answered. The patient agreed with the plan and demonstrated an understanding of the instructions.   The patient was advised to call back or seek an in-person evaluation if the symptoms worsen or if the condition fails to improve as anticipated.  I provided 25 minutes of non-face-to-face time during this encounter. Return for physical.   Micheline Rough, MD

## 2019-11-16 ENCOUNTER — Other Ambulatory Visit: Payer: Self-pay

## 2019-11-16 ENCOUNTER — Telehealth: Payer: Self-pay | Admitting: Interventional Cardiology

## 2019-11-16 ENCOUNTER — Other Ambulatory Visit: Payer: Self-pay | Admitting: Obstetrics and Gynecology

## 2019-11-16 ENCOUNTER — Ambulatory Visit
Admission: RE | Admit: 2019-11-16 | Discharge: 2019-11-16 | Disposition: A | Discharge status: Home / Self Care | Payer: No Typology Code available for payment source | Source: Ambulatory Visit | Attending: Obstetrics and Gynecology | Admitting: Obstetrics and Gynecology

## 2019-11-16 DIAGNOSIS — R928 Other abnormal and inconclusive findings on diagnostic imaging of breast: Secondary | ICD-10-CM

## 2019-11-16 DIAGNOSIS — Z1231 Encounter for screening mammogram for malignant neoplasm of breast: Secondary | ICD-10-CM

## 2019-11-16 DIAGNOSIS — I493 Ventricular premature depolarization: Secondary | ICD-10-CM

## 2019-11-16 NOTE — Telephone Encounter (Signed)
Spoke with pt and reviewed results and recommendations of monitor.  Pt verbalized understanding and was in agreement with this plan.

## 2019-11-16 NOTE — Telephone Encounter (Signed)
New message   Patient states that she is returning call. Please call.

## 2019-11-21 ENCOUNTER — Other Ambulatory Visit: Payer: Self-pay

## 2019-11-21 ENCOUNTER — Ambulatory Visit (HOSPITAL_COMMUNITY): Payer: No Typology Code available for payment source | Attending: Cardiology

## 2019-11-21 DIAGNOSIS — R011 Cardiac murmur, unspecified: Secondary | ICD-10-CM | POA: Insufficient documentation

## 2019-11-22 ENCOUNTER — Telehealth: Payer: Self-pay | Admitting: *Deleted

## 2019-11-22 ENCOUNTER — Encounter: Payer: Self-pay | Admitting: *Deleted

## 2019-11-22 NOTE — Telephone Encounter (Signed)
Left message regaarding appointemnt for Cardiac MRI scheduled 12/15/19 at 12:00 pm at [facility]---arrival time is 11:15 am 1st floor radiology department.  Will mail information to patient and it is also in My Chart

## 2019-11-25 ENCOUNTER — Encounter (HOSPITAL_COMMUNITY): Payer: Self-pay

## 2019-11-25 ENCOUNTER — Telehealth (HOSPITAL_COMMUNITY): Payer: Self-pay | Admitting: Emergency Medicine

## 2019-11-25 NOTE — Telephone Encounter (Signed)
Reaching out to patient to offer assistance regarding upcoming cardiac imaging study; pt verbalizes understanding of appt date/time, parking situation and where to check in,and verified current allergies; name and call back number provided for further questions should they arise Jayshun Galentine RN Navigator Cardiac Imaging [facility] [phone] office [phone] cell   Pt denies claustro, denies implants 

## 2019-11-28 ENCOUNTER — Other Ambulatory Visit: Payer: Self-pay

## 2019-11-28 ENCOUNTER — Ambulatory Visit (HOSPITAL_COMMUNITY)
Admission: RE | Admit: 2019-11-28 | Discharge: 2019-11-28 | Disposition: A | Discharge status: Home / Self Care | Payer: No Typology Code available for payment source | Source: Ambulatory Visit | Attending: Interventional Cardiology | Admitting: Interventional Cardiology

## 2019-11-28 DIAGNOSIS — I493 Ventricular premature depolarization: Secondary | ICD-10-CM | POA: Insufficient documentation

## 2019-11-28 MED ORDER — GADOBUTROL 1 MMOL/ML IV SOLN
10.0000 mL | Freq: Once | INTRAVENOUS | Status: AC | PRN
  Administered 2019-11-28: 10 mL via INTRAVENOUS

## 2019-11-28 MED FILL — PROPRANOLOL 20 MG TABLET: 20 | 30 days supply | Qty: 60 | Fill #1

## 2019-11-29 ENCOUNTER — Encounter: Payer: Self-pay | Admitting: Family Medicine

## 2019-11-30 ENCOUNTER — Other Ambulatory Visit: Payer: Self-pay | Admitting: Obstetrics and Gynecology

## 2019-12-02 ENCOUNTER — Ambulatory Visit
Admission: RE | Admit: 2019-12-02 | Discharge: 2019-12-02 | Disposition: A | Discharge status: Home / Self Care | Payer: No Typology Code available for payment source | Source: Ambulatory Visit | Attending: Obstetrics and Gynecology | Admitting: Obstetrics and Gynecology

## 2019-12-02 ENCOUNTER — Other Ambulatory Visit: Payer: Self-pay | Admitting: *Deleted

## 2019-12-02 ENCOUNTER — Ambulatory Visit: Payer: No Typology Code available for payment source

## 2019-12-02 ENCOUNTER — Other Ambulatory Visit: Payer: Self-pay

## 2019-12-02 DIAGNOSIS — R928 Other abnormal and inconclusive findings on diagnostic imaging of breast: Secondary | ICD-10-CM

## 2019-12-02 DIAGNOSIS — I493 Ventricular premature depolarization: Secondary | ICD-10-CM

## 2019-12-06 ENCOUNTER — Telehealth: Payer: Self-pay

## 2019-12-07 ENCOUNTER — Encounter: Payer: Self-pay | Admitting: Internal Medicine

## 2019-12-07 ENCOUNTER — Other Ambulatory Visit: Payer: Self-pay

## 2019-12-07 ENCOUNTER — Telehealth (INDEPENDENT_AMBULATORY_CARE_PROVIDER_SITE_OTHER): Payer: No Typology Code available for payment source | Admitting: Internal Medicine

## 2019-12-07 VITALS — Ht 62.0 in | Wt 135.0 lb

## 2019-12-07 DIAGNOSIS — I493 Ventricular premature depolarization: Secondary | ICD-10-CM

## 2019-12-07 NOTE — Progress Notes (Signed)
Electrophysiology TeleHealth Note   Due to national recommendations of social distancing due to [place] 19, Audio/video telehealth visit is felt to be most appropriate for this patient at this time.  See MyChart message from today for patient consent regarding telehealth for [facility].   Date:  12/07/2019   ID:  [name], DOB [birth year], MRN [id number]  Location: home Provider location: [address], [place] Evaluation Performed: New patient consult  PCP:  Caren Macadam, MD  Cardiologist:  Sinclair Grooms, MD  Electrophysiologist:  None   Chief Complaint:  PVCs  History of Present Illness:    [name] is a 39 y.o. female who presents via audio/video conferencing for a telehealth visit today.   The patient is referred for new consultation regarding PVCs by Dr Tamala Julian.  She reports having palpitations for several years, since her second pregnancy (2012).  These occur intermittently.  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  Echo/ mri are normal.  She has occasional headaches and has started propranolol.  She has rare palpitations.  Energy is good.  She does not exercise regularly. She reports having syncope in 2014 but denies other episodes.  She does not think that she had PVCs at the time.  She did not seek medical therapy.  Today, she denies symptoms of chest pain, shortness of breath,  lower extremity edema, claudication, dizziness, presyncope, syncope, bleeding, or neurologic sequela. The patient is tolerating medications without difficulties and is otherwise without complaint today.    Past Medical History:  Diagnosis Date  . Anxiety    on zyprexa in the past - stopped medication in 2015  . Blood in stool   . Chicken pox   . Gestational diabetes    gestational diabetes  . Hypoglycemia    has seen endocrinologist  . Migraines   . PVC (premature ventricular contraction)   . Urinary tract infection     Past  Surgical History:  Procedure Laterality Date  . CESAREAN SECTION     [id number]    Current Outpatient Medications  Medication Sig Dispense Refill  . cetirizine (ZYRTEC) 10 MG tablet Take 1 tablet (10 mg total) by mouth daily. 30 tablet 1  . fluticasone (FLONASE) 50 MCG/ACT nasal spray Place 2 sprays into both nostrils daily. 16 g 0  . propranolol (INDERAL) 20 MG tablet Take 1 tablet (20 mg total) by mouth 2 (two) times daily. 60 tablet 5  . rizatriptan (MAXALT-MLT) 10 MG disintegrating tablet Take 1 tablet (10 mg total) by mouth as needed for migraine. May repeat in 2 hours if needed 9 tablet 11   No current facility-administered medications for this visit.    Allergies:   Patient has no known allergies.   Social History:  The patient  reports that she has never smoked. She has never used smokeless tobacco. She reports current alcohol use. She reports that she does not use drugs.   Family History:  The patient's family history includes ALS in her paternal grandmother; Breast cancer in her paternal aunt; Breast cancer (age of onset: 52) in her maternal grandmother; Diabetes in her paternal grandmother; Healthy in her brother, father, and mother; Heart disease in her paternal grandfather; Infertility in her sister.   She denies FH of sudden death or arrhythmias.   ROS:  Please see the history of present illness.   All other systems are personally reviewed and negative.  Exam:    Vital Signs:  Ht 5\' 2"  (1.575 m)   Wt 135 lb (61.2 kg)   BMI 24.69 kg/m    Well appearing, alert and conversant, regular work of breathing,  good skin color Eyes- anicteric, neuro- grossly intact, skin- no apparent rash or lesions or cyanosis, mouth- oral mucosa is pink   Labs/Other Tests and Data Reviewed:    Recent Labs: 11/02/2019: BUN 8; Creatinine, Ser 0.69; Hemoglobin 13.0; Magnesium 2.0; Platelets 240; Potassium 4.3; Sodium 138; TSH 2.170   Wt Readings from Last 3 Encounters:  12/07/19 135 lb  (61.2 kg)  11/02/19 139 lb 6.4 oz (63.2 kg)  09/22/19 136 lb (61.7 kg)     Other studies personally reviewed: Additional studies/ records that were reviewed today include: recent event monitor, ekgs, MRI, office notes  Review of the above records today demonstrates: as noted  ASSESSMENT & PLAN:    1.  PVCs The patient has a relatively high PVC burden but unfortunately, has no PVCs on prior office ekgs.  She has mild symptoms which she feels are controlled with propranolol.  She has a preserved EF.  Normal cardiac MRI. We discussed options of AADs and ablation.  At this time, we are both in agreement that conservative measures are the best option for her.  We will continue to follow conservatively.  Should her symptoms worsen, we could consider options of flecainide or ablation.  Repeat echo in a year to make sure that she does not develop a secondary cardiomyopathy    Follow-up:  EP APP In 6 months  Current medicines are reviewed at length with the patient today.   The patient does not have concerns regarding her medicines.  The following changes were made today:  none  Labs/ tests ordered today include:  No orders of the defined types were placed in this encounter.   Patient Risk:  after full review of this patients clinical status, I feel that they are at moderate risk at this time.   Today, I have spent 20 minutes with the patient with telehealth technology discussing PVCs .    Signed, Thompson Grayer MD, [place] 12/07/2019 4:20 PM   [place] [address] [phone] (office) [phone] (fax)

## 2019-12-15 ENCOUNTER — Other Ambulatory Visit (HOSPITAL_COMMUNITY): Payer: No Typology Code available for payment source

## 2020-01-25 ENCOUNTER — Ambulatory Visit: Payer: No Typology Code available for payment source | Admitting: Interventional Cardiology

## 2020-01-27 MED FILL — PROPRANOLOL 20 MG TABLET: 20 | 30 days supply | Qty: 60 | Fill #3

## 2020-02-10 MED FILL — PROPRANOLOL 20 MG TABLET: 20 | 30 days supply | Qty: 60 | Fill #4

## 2020-03-06 ENCOUNTER — Telehealth: Payer: No Typology Code available for payment source | Admitting: Family Medicine

## 2020-03-26 MED FILL — PROPRANOLOL 20 MG TABLET: 20 | 30 days supply | Qty: 60 | Fill #5

## 2020-04-16 NOTE — Progress Notes (Addendum)
PATIENT: [name] DOB: 05-17-1981  REASON FOR VISIT: follow up HISTORY FROM: patient  Virtual Visit via Telephone Note  I connected with [name] on 04/17/20 at  8:00 AM EDT by telephone and verified that I am speaking with the correct person using two identifiers.   I discussed the limitations, risks, security and privacy concerns of performing an evaluation and management service by telephone and the availability of in person appointments. I also discussed with the patient that there may be a patient responsible charge related to this service. The patient expressed understanding and agreed to proceed.   History of Present Illness:  04/17/20 [name] is a 39 y.o. female here today for follow up for migraines. She continues propranolol 20mg  BID and rizatriptan as needed. She feels that propranolol has helped with headache intensity. She feels that head frequency continues to be a concern. She has a headache at least 4-5 times a week but feels they are usually fairly mild. She has not needed to take rizatriptan. She will use naproxen or Tylenol as needed, especially around menses. She estimates taking OTC medications at least 1/2 of the month.    History (copied from my note on 09/07/2019)  [name] is a 39 y.o. female here today for follow up. She was seen by Dr Jaynee Eagles in 12/2017. She tried taking naproxen before menstrual cycle but does not feel that this has helped. She usually takes naproxen and Tylenol regularly for at least 2 weeks of the month. She had a migraine that lasted about a week recently. Pain is usually right sided. Pounding. Can have pain behind right eye, can be dull ache of her whole right side. She does not usually have light or sound sensitivity. Some nausea when it is bad. Cool compress does help.  She does report occasional dizziness with quick turns of her head.  She has had some right ear pain recently.  She does have a history of seasonal allergies.  She is  not currently taking any antihistamines or nasal steroids.  MRI was unremarkable in 12/2017 with exception of benign cyst and chronic sinusitis.    She was last seen by Dr Tamala Julian, cardiology, last year for concerns of arrhythmia.  HISTORY: (copied from Dr Cathren Laine note on 12/24/2017)  ZOX:WRUE F Lamb a 39 y.o.femalehere as a referral from Dr. Wonda Olds migraines. No significant past medical historymild anxiety.She has migraines a few days before her period or a few days afterwards they seem to cluster. May happen other times of the month. Pounding in the forehead, movement makes it worse, she can wake up with the headaches. Worse also in the evening. Some nausea. She is very regular. Naproxen helps and extra strength tylenol help. Can last 4-24 hours untreated. No prodrome. Can have some "hangover". Alcohol is a trigger. 5-6 migraine days a month. Dad has headaches. She has episodes of dizziness. No vision loss, no sensory symptoms. The dizziness/veertigo is worsening.No other focal neurologic deficits, associated symptoms, inciting events or modifiable factors.  Reviewed notes, labs and imaging from outside physicians, which showed:  TSH normal  Reviewed referring physician notes. Patient's annual exam, reviewed, normal, no significant past medical history. Regular stable menstrual pattern. Patient has a history of migraines. Headaches occur when she has her periods sometimes occur at other times. She is taken naproxen or acetaminophen extra strength which generally seems to knock it out.   Observations/Objective:  Generalized: Well developed, in no acute distress  Mentation:  Alert oriented to time, place, history taking. Follows all commands speech and language fluent   Assessment and Plan:  39 y.o. year old female  has a past medical history of Anxiety, Blood in stool, Chicken pox, Gestational diabetes, Hypoglycemia, Migraines, PVC (premature ventricular contraction), and  Urinary tract infection. here with    ICD-10-CM   1. Migraine without aura and without status migrainosus, not intractable  G43.009     She is doing better after starting propranolol. She denies adverse effects with 20mg  BID dosing. I will switch her to propranolol LA 60mg  daily. I am hopeful this will help with headache frequency as well. She will continue OTC analgesics as needed during menses but was advised to try to limit use to 8-9 times a month. She may use rizatriptan as needed for migraine. Adequate hydration, allergy management and healthy lifestyle habits encouraged. She will call with progress report in 4-6 weeks. Plan to follow up in 1 year. She verbalizes understanding and agreement with this plan.   No orders of the defined types were placed in this encounter.   No orders of the defined types were placed in this encounter.    Follow Up Instructions:  I discussed the assessment and treatment plan with the patient. The patient was provided an opportunity to ask questions and all were answered. The patient agreed with the plan and demonstrated an understanding of the instructions.   The patient was advised to call back or seek an in-person evaluation if the symptoms worsen or if the condition fails to improve as anticipated.  I provided 15 minutes of non-face-to-face time during this encounter. Patient is located at her place of employment. Provider is in the office.    Debbora Presto, NP   Made any corrections needed, and agree with history, physical, neuro exam,assessment and plan as stated.     Sarina Ill, MD [facility]

## 2020-04-17 ENCOUNTER — Telehealth (INDEPENDENT_AMBULATORY_CARE_PROVIDER_SITE_OTHER): Payer: No Typology Code available for payment source | Admitting: Family Medicine

## 2020-04-17 ENCOUNTER — Other Ambulatory Visit (HOSPITAL_COMMUNITY): Payer: Self-pay | Admitting: Family Medicine

## 2020-04-17 ENCOUNTER — Encounter: Payer: Self-pay | Admitting: Family Medicine

## 2020-04-17 DIAGNOSIS — G43009 Migraine without aura, not intractable, without status migrainosus: Secondary | ICD-10-CM | POA: Diagnosis not present

## 2020-04-17 MED ORDER — PROPRANOLOL HCL ER 60 MG PO CP24: 60.0000 mg | ORAL_CAPSULE | Freq: Every day | ORAL | 3 refills | Status: DC

## 2020-04-17 MED FILL — PROPRANOLOL HCL ER 60 MG CP: 60 | 90 days supply | Qty: 90 | Fill #0

## 2020-05-08 IMAGING — MR MR CARD MORPHOLOGY WO/W CM
45 of 48 series · 45 of 48 positions shown · IV contrast (gadavist)
Comparison: none

CLINICAL DATA: PVC;s/ R/O ARVD

EXAM:
CARDIAC MRI
TECHNIQUE: The patient was scanned on a 1.5 Tesla Siemens magnet. A dedicated
cardiac coil was used. Functional imaging was done using Fiesta
sequences. [DATE], and 4 chamber views were done to assess for RWMA's.
Modified Mohazzy rule using a short axis stack was used to
calculate an ejection fraction on a dedicated work station using
Circle software. The patient received Gadavist after 10 minutes
inversion recovery sequences were used to assess for infiltration
and scar tissue.

[Series 7: bSSFP · oblique · 8.0mm · 1.61mm/px · 1 of 25 slices shown (1 of 27)]
[im 1/25]
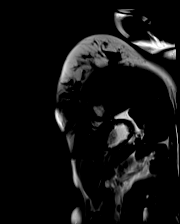

[Series 7: bSSFP · oblique · 8.0mm · 1.61mm/px · 1 of 25 slices shown (2 of 27)]
[im 1/25]
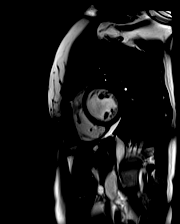

[Series 7: bSSFP · oblique · 8.0mm · 1.61mm/px · 1 of 25 slices shown (3 of 27)]
[im 1/25]
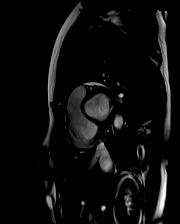

[Series 7: bSSFP · oblique · 8.0mm · 1.61mm/px · 1 of 25 slices shown (4 of 27)]
[im 1/25]
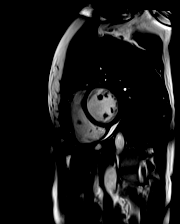

[Series 7: bSSFP · oblique · 8.0mm · 1.61mm/px · 1 of 25 slices shown (5 of 27)]
[im 1/25]
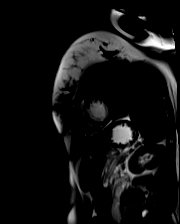

[Series 7: bSSFP · oblique · 8.0mm · 1.61mm/px · 1 of 25 slices shown (6 of 27)]
[im 1/25]
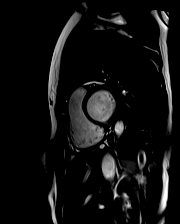

[Series 7: bSSFP · oblique · 8.0mm · 1.61mm/px · 1 of 25 slices shown (7 of 27)]
[im 1/25]
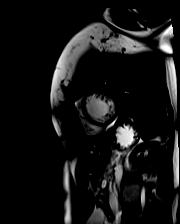

[Series 7: bSSFP · oblique · 8.0mm · 1.61mm/px · 1 of 25 slices shown (8 of 27)]
[im 1/25]
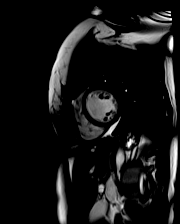

[Series 7: bSSFP · oblique · 8.0mm · 1.61mm/px · 1 of 25 slices shown (9 of 27)]
[im 1/25]
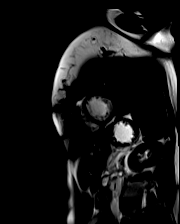

[Series 7: bSSFP · oblique · 8.0mm · 1.61mm/px · 1 of 25 slices shown (10 of 27)]
[im 1/25]
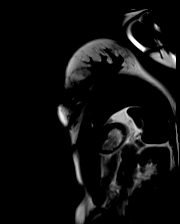

[Series 7: bSSFP · oblique · 8.0mm · 1.61mm/px · 1 of 25 slices shown (11 of 27)]
[im 1/25]
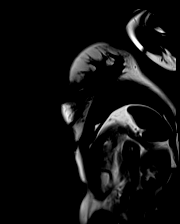

[Series 7: bSSFP · oblique · 8.0mm · 1.61mm/px · 1 of 25 slices shown (12 of 27)]
[im 1/25]
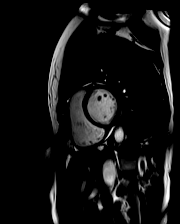

[Series 7: bSSFP · oblique · 8.0mm · 1.61mm/px · 1 of 25 slices shown (13 of 27)]
[im 1/25]
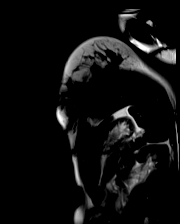

[Series 7: bSSFP · oblique · 8.0mm · 1.61mm/px · 1 of 25 slices shown (14 of 27)]
[im 1/25]
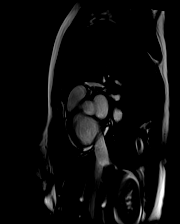

[Series 7: bSSFP · oblique · 8.0mm · 1.61mm/px · 1 of 25 slices shown (15 of 27)]
[im 1/25]
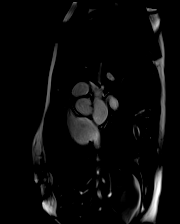

[Series 7: bSSFP · oblique · 8.0mm · 1.61mm/px · 1 of 25 slices shown (16 of 27)]
[im 1/25]
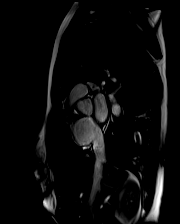

[Series 7: bSSFP · oblique · 8.0mm · 1.61mm/px · 1 of 25 slices shown (17 of 27)]
[im 1/25]
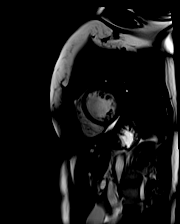

[Series 9: STIR · oblique · 8.0mm · 1.73mm/px · 1 of 17 slices shown]
[im 1/17]
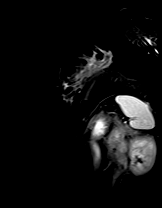

[Series 10: aortic valve thins · oblique · 5.0mm · 1.52mm/px · 1 of 25 slices shown (1 of 12)]
[im 1/25]
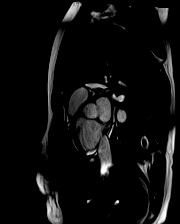

[Series 10: aortic valve thins · oblique · 5.0mm · 1.52mm/px · 1 of 25 slices shown (2 of 12)]
[im 1/25]
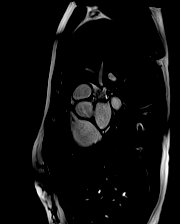

[Series 10: aortic valve thins · oblique · 5.0mm · 1.52mm/px · 1 of 25 slices shown (3 of 12)]
[im 1/25]
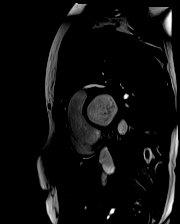

[Series 10: aortic valve thins · oblique · 5.0mm · 1.52mm/px · 1 of 25 slices shown (4 of 12)]
[im 1/25]
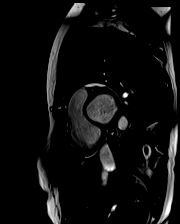

[Series 10: aortic valve thins · oblique · 5.0mm · 1.52mm/px · 1 of 25 slices shown (5 of 12)]
[im 1/25]
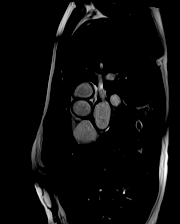

[Series 10: aortic valve thins · oblique · 5.0mm · 1.52mm/px · 1 of 25 slices shown (6 of 12)]
[im 1/25]
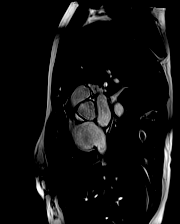

[Series 10: aortic valve thins · oblique · 5.0mm · 1.52mm/px · 1 of 25 slices shown (7 of 12)]
[im 1/25]
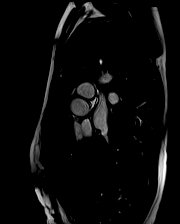

[Series 10: aortic valve thins · oblique · 5.0mm · 1.52mm/px · 1 of 25 slices shown (8 of 12)]
[im 1/25]
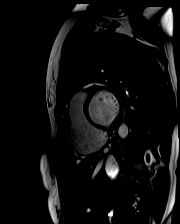

[Series 10: aortic valve thins · oblique · 5.0mm · 1.52mm/px · 1 of 25 slices shown (9 of 12)]
[im 1/25]
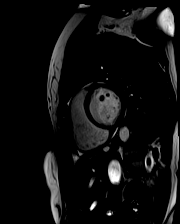

[Series 10: aortic valve thins · oblique · 5.0mm · 1.52mm/px · 1 of 25 slices shown (10 of 12)]
[im 1/25]
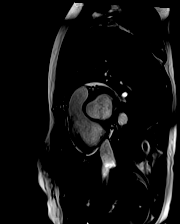

[Series 10: aortic valve thins · oblique · 5.0mm · 1.52mm/px · 1 of 25 slices shown (11 of 12)]
[im 1/25]
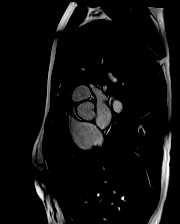

[Series 10: aortic valve thins · oblique · 5.0mm · 1.52mm/px · 1 of 25 slices shown (12 of 12)]
[im 1/25]
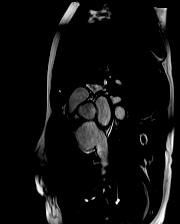

[Series 11: bSSFP · coronal · 6.0mm · 1.61mm/px · 1 of 25 slices shown (18 of 27)]
[im 1/25]
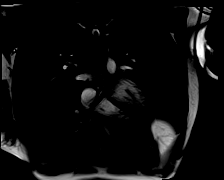

[Series 11: bSSFP · coronal · 6.0mm · 1.61mm/px · 1 of 25 slices shown (19 of 27)]
[im 1/25]
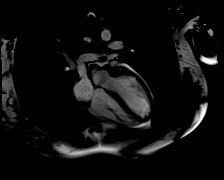

[Series 11: bSSFP · coronal · 6.0mm · 1.61mm/px · 1 of 25 slices shown (20 of 27)]
[im 1/25]
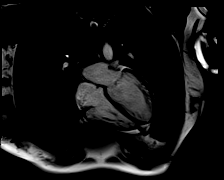

[Series 11: bSSFP · coronal · 6.0mm · 1.61mm/px · 1 of 25 slices shown (21 of 27)]
[im 1/25]
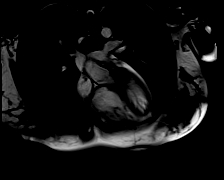

[Series 11: bSSFP · coronal · 6.0mm · 1.61mm/px · 1 of 25 slices shown (22 of 27)]
[im 1/25]
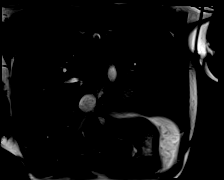

[Series 11: bSSFP · coronal · 6.0mm · 1.61mm/px · 1 of 25 slices shown (23 of 27)]
[im 1/25]
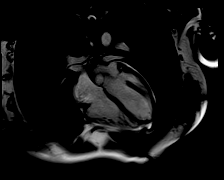

[Series 11: bSSFP · coronal · 6.0mm · 1.61mm/px · 1 of 25 slices shown (24 of 27)]
[im 1/25]
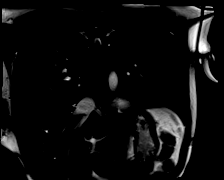

[Series 11: bSSFP · coronal · 6.0mm · 1.61mm/px · 1 of 25 slices shown (25 of 27)]
[im 1/25]
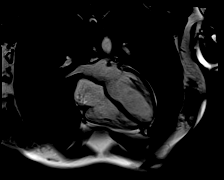

[Series 11: bSSFP · coronal · 6.0mm · 1.61mm/px · 1 of 25 slices shown (26 of 27)]
[im 1/25]
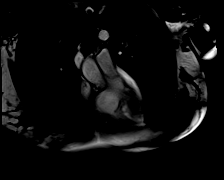

[Series 11: bSSFP · coronal · 6.0mm · 1.61mm/px · 1 of 25 slices shown (27 of 27)]
[im 1/25]
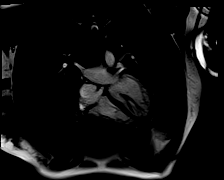

[Series 12: (id)_long_t1 · oblique · 8.0mm · 1.41mm/px · 1 of 24 slices shown]
[im 1/24]
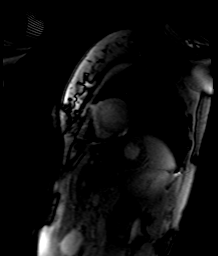

[Series 13: (id)_long_t1_moco · oblique · 8.0mm · 1.41mm/px · 1 of 24 slices shown]
[im 1/24]
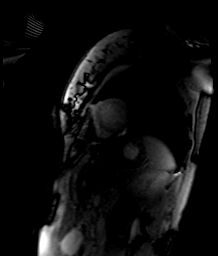

[Series 14: (id)_long_t1_moco_t1 · oblique · 8.0mm · 1.41mm/px · 1 of 6 slices shown]
[im 1/6]
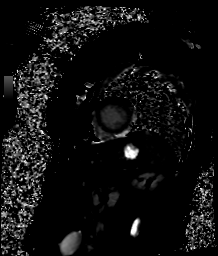

[Series 16: (id)_trufi · oblique · 8.0mm · 1.88mm/px · 1 of 9 slices shown]
[im 1/9]
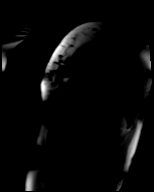

[Series 17: (id)_trufi_moco · oblique · 8.0mm · 1.88mm/px · 1 of 9 slices shown]
[im 1/9]
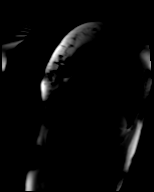

[45 of 48 positions shown; findings below may reference images not displayed]

CONTRAST:  Normal cardiac chamber sizes. No ASD/PFO. Normal aortic
root 2.9 cm. No pericardial effusion. Normal tri leaflet AV. Normal
TV. Mild MV thickening with mild MR

Normal RV size and function. No diverticulum or areas of
hypokinesis. RVEF 63% (EDV 152 cc ESV 57 cc SV 95 cc.) Normal LV
size and function No LVH or evidence of HOCM or non compaction. LVEF
66% (EDV 132 cc ESV 45 cc SV 88 cc) Delayed enhancement images with
gadolinium with no infarct, infiltration or hyper enhancement
FINDINGS: 1. Normal LV size and function EF 66%

2.  No delayed gadolinium enhancement in LV myocardium

3.  Normla RV size and function RVEF 63% no RV dysplasia

4.  Mild MV thickening with Mild MR
IMPRESSION: Walter Horacio Chuayre

## 2020-06-07 ENCOUNTER — Other Ambulatory Visit (HOSPITAL_COMMUNITY): Payer: Self-pay | Admitting: Internal Medicine

## 2020-06-07 MED FILL — FLUARIX QUADRIVALENT 0.5 ML: 0.5 | 1 days supply | Qty: 1 | Fill #0

## 2020-07-18 MED FILL — PROPRANOLOL HCL ER 60 MG CP: 60 | 90 days supply | Qty: 90 | Fill #1

## 2020-10-18 MED FILL — PROPRANOLOL HCL ER 60 MG CP: 60 | 90 days supply | Qty: 90 | Fill #2

## 2020-11-16 ENCOUNTER — Other Ambulatory Visit: Payer: Self-pay | Admitting: Pediatric Radiology

## 2020-11-16 ENCOUNTER — Other Ambulatory Visit: Payer: Self-pay | Admitting: Family Medicine

## 2020-11-16 DIAGNOSIS — Z1231 Encounter for screening mammogram for malignant neoplasm of breast: Secondary | ICD-10-CM

## 2020-12-14 ENCOUNTER — Ambulatory Visit (INDEPENDENT_AMBULATORY_CARE_PROVIDER_SITE_OTHER): Payer: No Typology Code available for payment source | Admitting: Family Medicine

## 2020-12-14 ENCOUNTER — Other Ambulatory Visit: Payer: Self-pay

## 2020-12-14 ENCOUNTER — Telehealth: Payer: No Typology Code available for payment source | Admitting: Family Medicine

## 2020-12-14 ENCOUNTER — Encounter: Payer: Self-pay | Admitting: Family Medicine

## 2020-12-14 ENCOUNTER — Other Ambulatory Visit (HOSPITAL_COMMUNITY): Payer: Self-pay

## 2020-12-14 VITALS — BP 120/76 | HR 65 | Temp 98.8°F | Wt 142.6 lb

## 2020-12-14 DIAGNOSIS — N390 Urinary tract infection, site not specified: Secondary | ICD-10-CM

## 2020-12-14 DIAGNOSIS — R300 Dysuria: Secondary | ICD-10-CM

## 2020-12-14 LAB — POC URINALSYSI DIPSTICK (AUTOMATED)
Bilirubin, UA: NEGATIVE
Glucose, UA: NEGATIVE
Ketones, UA: NEGATIVE
Leukocytes, UA: NEGATIVE
Nitrite, UA: NEGATIVE
Protein, UA: NEGATIVE
Spec Grav, UA: 1.01 (ref 1.010–1.025)
Urobilinogen, UA: 0.2 E.U./dL
pH, UA: 6 (ref 5.0–8.0)

## 2020-12-14 MED ORDER — CIPROFLOXACIN HCL 500 MG PO TABS
500.0000 mg | ORAL_TABLET | Freq: Two times a day (BID) | ORAL | 0 refills | Status: DC
  Filled 2020-12-14: qty 14, 7d supply, fill #0

## 2020-12-14 NOTE — Progress Notes (Signed)
   Subjective:    Patient ID: [name], female    DOB: December 16, 1980, 40 y.o.   MRN: [id number]  HPI Here for 6 days of urinary burning and having a foul odor to the urine. No fever or back pain or nausea. Drinking water and cranberry juice.    Review of Systems  Constitutional: Negative.   Respiratory: Negative.   Cardiovascular: Negative.   Genitourinary: Positive for dysuria. Negative for flank pain and urgency.       Objective:   Physical Exam Constitutional:      Appearance: Normal appearance.  Cardiovascular:     Rate and Rhythm: Normal rate and regular rhythm.     Pulses: Normal pulses.     Heart sounds: Normal heart sounds.  Pulmonary:     Effort: Pulmonary effort is normal.     Breath sounds: Normal breath sounds.  Abdominal:     General: Abdomen is flat. Bowel sounds are normal. There is no distension.     Palpations: Abdomen is soft. There is no mass.     Tenderness: There is no abdominal tenderness. There is no right CVA tenderness, left CVA tenderness, guarding or rebound.     Hernia: No hernia is present.  Neurological:     Mental Status: She is alert.           Assessment & Plan:  UTI, treat with Cipro. Culture the sample.  Alysia Penna, MD

## 2020-12-14 NOTE — Addendum Note (Signed)
Addended by: Tessie Fass D on: 12/14/2020 03:45 PM   Modules accepted: Orders

## 2020-12-15 LAB — URINE CULTURE
MICRO NUMBER:: 11803423
SPECIMEN QUALITY:: ADEQUATE

## 2021-01-08 ENCOUNTER — Ambulatory Visit
Admission: RE | Admit: 2021-01-08 | Discharge: 2021-01-08 | Disposition: A | Discharge status: Home / Self Care | Payer: No Typology Code available for payment source | Source: Ambulatory Visit | Attending: Family Medicine | Admitting: Family Medicine

## 2021-01-08 ENCOUNTER — Encounter: Payer: Self-pay | Admitting: Family Medicine

## 2021-01-08 ENCOUNTER — Other Ambulatory Visit: Payer: Self-pay

## 2021-01-08 DIAGNOSIS — Z1231 Encounter for screening mammogram for malignant neoplasm of breast: Secondary | ICD-10-CM

## 2021-01-09 ENCOUNTER — Ambulatory Visit: Payer: No Typology Code available for payment source

## 2021-01-09 ENCOUNTER — Other Ambulatory Visit: Payer: Self-pay | Admitting: Family Medicine

## 2021-01-09 DIAGNOSIS — R928 Other abnormal and inconclusive findings on diagnostic imaging of breast: Secondary | ICD-10-CM

## 2021-01-18 ENCOUNTER — Other Ambulatory Visit (HOSPITAL_COMMUNITY): Payer: Self-pay

## 2021-01-18 ENCOUNTER — Other Ambulatory Visit (HOSPITAL_BASED_OUTPATIENT_CLINIC_OR_DEPARTMENT_OTHER): Payer: Self-pay

## 2021-01-18 MED FILL — Propranolol HCl Cap ER 24HR 60 MG: ORAL | 90 days supply | Qty: 90 | Fill #0 | Status: CN

## 2021-01-18 MED FILL — Propranolol HCl Cap ER 24HR 60 MG: ORAL | 90 days supply | Qty: 90 | Fill #0 | Status: AC

## 2021-02-01 ENCOUNTER — Other Ambulatory Visit: Payer: Self-pay

## 2021-02-01 ENCOUNTER — Ambulatory Visit: Payer: No Typology Code available for payment source

## 2021-02-01 ENCOUNTER — Ambulatory Visit
Admission: RE | Admit: 2021-02-01 | Discharge: 2021-02-01 | Disposition: A | Discharge status: Home / Self Care | Payer: No Typology Code available for payment source | Source: Ambulatory Visit | Attending: Family Medicine | Admitting: Family Medicine

## 2021-02-01 DIAGNOSIS — R928 Other abnormal and inconclusive findings on diagnostic imaging of breast: Secondary | ICD-10-CM

## 2021-02-04 ENCOUNTER — Ambulatory Visit (INDEPENDENT_AMBULATORY_CARE_PROVIDER_SITE_OTHER): Payer: No Typology Code available for payment source | Admitting: Family Medicine

## 2021-02-04 ENCOUNTER — Encounter: Payer: Self-pay | Admitting: Family Medicine

## 2021-02-04 ENCOUNTER — Other Ambulatory Visit: Payer: Self-pay

## 2021-02-04 DIAGNOSIS — Z0000 Encounter for general adult medical examination without abnormal findings: Secondary | ICD-10-CM | POA: Diagnosis not present

## 2021-02-04 DIAGNOSIS — G43009 Migraine without aura, not intractable, without status migrainosus: Secondary | ICD-10-CM | POA: Diagnosis not present

## 2021-02-04 DIAGNOSIS — R350 Frequency of micturition: Secondary | ICD-10-CM | POA: Diagnosis not present

## 2021-02-04 DIAGNOSIS — Z131 Encounter for screening for diabetes mellitus: Secondary | ICD-10-CM | POA: Diagnosis not present

## 2021-02-04 DIAGNOSIS — Z13220 Encounter for screening for lipoid disorders: Secondary | ICD-10-CM

## 2021-02-04 LAB — CBC WITH DIFFERENTIAL/PLATELET
Basophils Absolute: 0.1 10*3/uL (ref 0.0–0.1)
Basophils Relative: 1.3 % (ref 0.0–3.0)
Eosinophils Absolute: 0.2 10*3/uL (ref 0.0–0.7)
Eosinophils Relative: 3.7 % (ref 0.0–5.0)
HCT: 37.9 % (ref 36.0–46.0)
Hemoglobin: 13 g/dL (ref 12.0–15.0)
Lymphocytes Relative: 27.7 % (ref 12.0–46.0)
Lymphs Abs: 1.4 10*3/uL (ref 0.7–4.0)
MCHC: 34.2 g/dL (ref 30.0–36.0)
MCV: 87.4 fl (ref 78.0–100.0)
Monocytes Absolute: 0.5 10*3/uL (ref 0.1–1.0)
Monocytes Relative: 9.5 % (ref 3.0–12.0)
Neutro Abs: 3 10*3/uL (ref 1.4–7.7)
Neutrophils Relative %: 57.8 % (ref 43.0–77.0)
Platelets: 239 10*3/uL (ref 150.0–400.0)
RBC: 4.33 Mil/uL (ref 3.87–5.11)
RDW: 12.7 % (ref 11.5–15.5)
WBC: 5.2 10*3/uL (ref 4.0–10.5)

## 2021-02-04 LAB — COMPREHENSIVE METABOLIC PANEL
ALT: 10 U/L (ref 0–35)
AST: 12 U/L (ref 0–37)
Albumin: 4.7 g/dL (ref 3.5–5.2)
Alkaline Phosphatase: 54 U/L (ref 39–117)
BUN: 12 mg/dL (ref 6–23)
CO2: 24 mEq/L (ref 19–32)
Calcium: 9.1 mg/dL (ref 8.4–10.5)
Chloride: 106 mEq/L (ref 96–112)
Creatinine, Ser: 0.69 mg/dL (ref 0.40–1.20)
GFR: 107.92 mL/min (ref >60.00)
Glucose, Bld: 93 mg/dL (ref 70–99)
Potassium: 4.3 mEq/L (ref 3.5–5.1)
Sodium: 137 mEq/L (ref 135–145)
Total Bilirubin: 1 mg/dL (ref 0.2–1.2)
Total Protein: 7.3 g/dL (ref 6.0–8.3)

## 2021-02-04 LAB — LIPID PANEL
Cholesterol: 164 mg/dL (ref 0–200)
HDL: 67.5 mg/dL (ref >39.00)
LDL Cholesterol: 79 mg/dL (ref 0–99)
NonHDL: 96.36
Total CHOL/HDL Ratio: 2
Triglycerides: 89 mg/dL (ref 0.0–149.0)
VLDL: 17.8 mg/dL (ref 0.0–40.0)

## 2021-02-04 LAB — TSH: TSH: 2.06 u[IU]/mL (ref 0.35–4.50)

## 2021-02-04 NOTE — Patient Instructions (Signed)
Riboflavin - 400mg  daily for migraine prevention

## 2021-02-04 NOTE — Progress Notes (Signed)
[name] DOB: 06-13-1981 Encounter date: 02/04/2021  This is a 40 y.o. female who presents for complete physical   History of present illness/Additional concerns:  Migraine: following with neurology. She is on the propranolol for preventative. Headaches are more annoying than severe. Usually gets headache a few days before, during and a few days after - like 2 weeks of on and off headaches. Periods are very regularly. Does take something for headaches - tylenol, naproxen. Usually doesn't take the rizatriptan - hasn't had this - just on hold for severe headache.   Saw cardio last year for abnormal heart rhythm. Palpitations haven't been an issue. Thinks propranolol may be helping. She is exercising daily - cardio.   Follows with gyn yearly. Follows with derm yearly.mammogram completed 6/10 (follow up for abnormal initial and 6/10 mammogram was negative)   Past Medical History:  Diagnosis Date   Anxiety    on zyprexa in the past - stopped medication in 2015   Blood in stool    Chicken pox    Gestational diabetes    gestational diabetes   Hypoglycemia    has seen endocrinologist   Migraines    PVC (premature ventricular contraction)    Urinary tract infection    Past Surgical History:  Procedure Laterality Date   CESAREAN SECTION     2009,2012   No Known Allergies Current Meds  Medication Sig   cetirizine (ZYRTEC) 10 MG tablet Take 1 tablet (10 mg total) by mouth daily.   fluticasone (FLONASE) 50 MCG/ACT nasal spray Place 2 sprays into both nostrils daily.   influenza vac split quadrivalent PF (FLUARIX) 0.5 ML injection INJECT AS DIRECTED   propranolol ER (INDERAL LA) 60 MG 24 hr capsule Take 1 capsule (60 mg total) by mouth daily.   rizatriptan (MAXALT-MLT) 10 MG disintegrating tablet Take 1 tablet (10 mg total) by mouth as needed for migraine. May repeat in 2 hours if needed   [DISCONTINUED] propranolol ER (INDERAL LA) 60 MG 24 hr capsule TAKE 1 CAPSULE (60 MG TOTAL) BY  MOUTH DAILY.   Social History   Tobacco Use   Smoking status: Never   Smokeless tobacco: Never  Substance Use Topics   Alcohol use: Yes    Comment: occasional   Family History  Problem Relation Age of Onset   Healthy Mother    Healthy Father    Diabetes Paternal Grandmother    ALS Paternal Grandmother    Breast cancer Paternal Aunt    Breast cancer Maternal Grandmother 21       (died in her late 69's after mets)   Heart disease Paternal Grandfather    Infertility Sister    Healthy Brother      Review of Systems  Constitutional:  Negative for activity change, appetite change, chills, fatigue, fever and unexpected weight change.  HENT:  Negative for congestion, ear pain, hearing loss, sinus pressure, sinus pain, sore throat and trouble swallowing.   Eyes:  Negative for pain and visual disturbance.  Respiratory:  Negative for cough, chest tightness, shortness of breath and wheezing.   Cardiovascular:  Negative for chest pain, palpitations and leg swelling.  Gastrointestinal:  Negative for abdominal pain, blood in stool, constipation, diarrhea, nausea and vomiting.  Genitourinary:  Negative for difficulty urinating and menstrual problem.  Musculoskeletal:  Negative for arthralgias and back pain.  Skin:  Negative for rash.  Neurological:  Negative for dizziness, weakness, numbness and headaches.  Hematological:  Negative for adenopathy. Does not bruise/bleed easily.  Psychiatric/Behavioral:  Negative for sleep disturbance and suicidal ideas. The patient is not nervous/anxious.    CBC:  Lab Results  Component Value Date   WBC 4.2 11/02/2019   WBC 6.8 06/16/2017   HGB 13.0 11/02/2019   HCT 37.5 11/02/2019   MCH 30.2 11/02/2019   MCH 29.2 12/25/2011   MCHC 34.7 11/02/2019   MCHC 33.9 06/16/2017   RDW 12.1 11/02/2019   PLT 240 11/02/2019   CMP: Lab Results  Component Value Date   NA 138 11/02/2019   K 4.3 11/02/2019   CL 103 11/02/2019   CO2 19 (L) 11/02/2019    GLUCOSE 93 11/02/2019   GLUCOSE 93 06/16/2017   BUN 8 11/02/2019   CREATININE 0.69 11/02/2019   GFRAA 127 11/02/2019   CALCIUM 9.6 11/02/2019   LIPID: Lab Results  Component Value Date   CHOL 168 07/29/2018   TRIG 48.0 07/29/2018   HDL 79.70 07/29/2018   LDLCALC 79 07/29/2018    Objective:  LMP 01/11/2021 (Exact Date)       BP Readings from Last 3 Encounters:  12/14/20 120/76  11/02/19 126/80  09/22/19 140/78   Wt Readings from Last 3 Encounters:  12/14/20 142 lb 9.6 oz (64.7 kg)  12/07/19 135 lb (61.2 kg)  11/02/19 139 lb 6.4 oz (63.2 kg)    Physical Exam Constitutional:      General: She is not in acute distress.    Appearance: She is well-developed.  HENT:     Head: Normocephalic and atraumatic.     Right Ear: External ear normal.     Left Ear: External ear normal.     Mouth/Throat:     Pharynx: No oropharyngeal exudate.  Eyes:     Conjunctiva/sclera: Conjunctivae normal.     Pupils: Pupils are equal, round, and reactive to light.  Neck:     Thyroid: No thyromegaly.  Cardiovascular:     Rate and Rhythm: Normal rate and regular rhythm.     Heart sounds: Normal heart sounds. No murmur heard.   No friction rub. No gallop.  Pulmonary:     Effort: Pulmonary effort is normal.     Breath sounds: Normal breath sounds.  Abdominal:     General: Bowel sounds are normal. There is no distension.     Palpations: Abdomen is soft. There is no mass.     Tenderness: There is no abdominal tenderness. There is no guarding.     Hernia: No hernia is present.  Musculoskeletal:        General: No tenderness or deformity. Normal range of motion.     Cervical back: Normal range of motion and neck supple.  Lymphadenopathy:     Cervical: No cervical adenopathy.  Skin:    General: Skin is warm and dry.     Findings: No rash.  Neurological:     Mental Status: She is alert and oriented to person, place, and time.     Deep Tendon Reflexes: Reflexes normal.     Reflex Scores:       Tricep reflexes are 2+ on the right side and 2+ on the left side.      Bicep reflexes are 2+ on the right side and 2+ on the left side.      Brachioradialis reflexes are 2+ on the right side and 2+ on the left side.      Patellar reflexes are 2+ on the right side and 2+ on the left side. Psychiatric:  Speech: Speech normal.        Behavior: Behavior normal.        Thought Content: Thought content normal.    Assessment/Plan: Health Maintenance Due  Topic Date Due   TETANUS/TDAP  06/15/2019   Health Maintenance reviewed - up to date.   1. Preventative health care Keep up with healthy lifestyle.   2. Lipid screening - Lipid panel; Future  3. Screening for diabetes mellitus - Comprehensive metabolic panel; Future  4. Urinary frequency Happening on occasion.  - Urinalysis with Culture Reflex; Future - CBC with Differential/Platelet; Future  5. Migraine without aura and without status migrainosus, not intractable Encouraged her to discuss some additional treatment options with neurology. Trial riboflavin 400mg  daily, consider monthly preventative injection like aimovig or cyclical treatment.   - TSH; Future - Magnesium, RBC; Future   Return in about 1 year (around 02/04/2022) for physical exam.  Micheline Rough, MD

## 2021-02-04 NOTE — Addendum Note (Signed)
Addended by: Elmer Picker on: 02/04/2021 08:47 AM   Modules accepted: Orders

## 2021-02-05 LAB — URINALYSIS W MICROSCOPIC + REFLEX CULTURE
Bacteria, UA: NONE SEEN /HPF
Bilirubin Urine: NEGATIVE
Glucose, UA: NEGATIVE
Hgb urine dipstick: NEGATIVE
Hyaline Cast: NONE SEEN /LPF
Ketones, ur: NEGATIVE
Leukocyte Esterase: NEGATIVE
Nitrites, Initial: NEGATIVE
Protein, ur: NEGATIVE
RBC / HPF: NONE SEEN /HPF (ref 0–2)
Specific Gravity, Urine: 1.008 (ref 1.001–1.035)
WBC, UA: NONE SEEN /HPF (ref 0–5)
pH: 5.5 (ref 5.0–8.0)

## 2021-02-05 LAB — NO CULTURE INDICATED

## 2021-02-07 LAB — MAGNESIUM, RBC: Magnesium RBC: 4.6 mg/dL (ref 4.0–6.4)

## 2021-03-20 ENCOUNTER — Other Ambulatory Visit (HOSPITAL_BASED_OUTPATIENT_CLINIC_OR_DEPARTMENT_OTHER): Payer: Self-pay

## 2021-03-20 MED ORDER — CARESTART COVID-19 HOME TEST VI KIT
PACK | 0 refills | Status: DC
  Filled 2021-03-20: qty 4, 8d supply, fill #0

## 2021-04-19 ENCOUNTER — Other Ambulatory Visit: Payer: Self-pay | Admitting: Family Medicine

## 2021-04-19 ENCOUNTER — Other Ambulatory Visit (HOSPITAL_BASED_OUTPATIENT_CLINIC_OR_DEPARTMENT_OTHER): Payer: Self-pay

## 2021-04-22 ENCOUNTER — Other Ambulatory Visit: Payer: Self-pay | Admitting: Family Medicine

## 2021-04-22 ENCOUNTER — Other Ambulatory Visit (HOSPITAL_BASED_OUTPATIENT_CLINIC_OR_DEPARTMENT_OTHER): Payer: Self-pay

## 2021-04-22 MED ORDER — PROPRANOLOL HCL ER 60 MG PO CP24
ORAL_CAPSULE | Freq: Every day | ORAL | 0 refills | Status: DC
  Filled 2021-04-22: qty 90, 90d supply, fill #0

## 2021-04-23 ENCOUNTER — Other Ambulatory Visit (HOSPITAL_BASED_OUTPATIENT_CLINIC_OR_DEPARTMENT_OTHER): Payer: Self-pay

## 2021-04-23 MED ORDER — COVID-19 AT HOME ANTIGEN TEST VI KIT
PACK | 0 refills | Status: DC
  Filled 2021-04-23: qty 2, 4d supply, fill #0

## 2021-05-15 ENCOUNTER — Telehealth: Payer: Self-pay | Admitting: Internal Medicine

## 2021-05-15 NOTE — Telephone Encounter (Signed)
I am ok with this.  She is overdue for follow-up.

## 2021-05-15 NOTE — Telephone Encounter (Signed)
New message  Pt would like to switch providers from Dr. Rayann Heman to Dr. Quentin Ore. Is this switch approved?

## 2021-05-16 NOTE — Telephone Encounter (Signed)
Ok per Dr. Quentin Ore.

## 2021-05-27 ENCOUNTER — Other Ambulatory Visit (HOSPITAL_BASED_OUTPATIENT_CLINIC_OR_DEPARTMENT_OTHER): Payer: Self-pay

## 2021-05-27 ENCOUNTER — Ambulatory Visit: Payer: No Typology Code available for payment source | Admitting: Family Medicine

## 2021-05-27 ENCOUNTER — Encounter: Payer: Self-pay | Admitting: Family Medicine

## 2021-05-27 ENCOUNTER — Other Ambulatory Visit: Payer: Self-pay

## 2021-05-27 VITALS — BP 142/87 | HR 63 | Ht 62.0 in | Wt 145.0 lb

## 2021-05-27 DIAGNOSIS — G44209 Tension-type headache, unspecified, not intractable: Secondary | ICD-10-CM | POA: Diagnosis not present

## 2021-05-27 DIAGNOSIS — G43009 Migraine without aura, not intractable, without status migrainosus: Secondary | ICD-10-CM

## 2021-05-27 MED ORDER — PROPRANOLOL HCL ER 60 MG PO CP24
ORAL_CAPSULE | Freq: Every day | ORAL | 3 refills | Status: DC
  Filled 2021-05-27: qty 90, 90d supply, fill #0

## 2021-05-27 MED ORDER — NORTRIPTYLINE HCL 10 MG PO CAPS
10.0000 mg | ORAL_CAPSULE | Freq: Every day | ORAL | 3 refills | Status: DC
  Filled 2021-05-27: qty 90, 90d supply, fill #0

## 2021-05-27 NOTE — Patient Instructions (Addendum)
Below is our plan:  We will continue propranolol ER 60mg  daily. I will add nortriptyline 10mg  at bedtime. Continue OTC medication for abortive therapy. Consider rizatriptan is you have a really bad headache. Consider CGRP injection in the future if headaches do not improve (Amovig, Ajovy, Emgality)  Please make sure you are staying well hydrated. I recommend 50-60 ounces daily. Well balanced diet and regular exercise encouraged. Consistent sleep schedule with 6-8 hours recommended.   Please continue follow up with care team as directed.   Follow up with me in 6 months, 12 months if doing well.   You may receive a survey regarding today's visit. I encourage you to leave honest feed back as I do use this information to improve patient care. Thank you for seeing me today!

## 2021-05-27 NOTE — Progress Notes (Signed)
Chief Complaint  Patient presents with   Follow-up    RM 1, alone. Last seen 04/17/20. No new sx, no improvement since last visit.      HISTORY OF PRESENT ILLNESS:  05/27/21 ALL:  [name] is a 40 y.o. female here today for follow up for migraines. We increased propranolol to 26m daily and continued rizatriptan as needed. She is not sure she has noted any improvement since last visit 03/2020. She does report irregular heart rate seems better but migraines seem to continue. She has near daily headaches the week prior to and week of her menstrual cycle. Headaches are usually bifrontal throbbing. Not usually associated with light or sound sensitivity. Rare nausea with really bad headache. She has difficulty staying asleep. She feels that her mind wont shut down at times. She is not on birth control. Husband had vasectomy.   04/17/20 ALL:  Claire COPPOLAis a 40y.o. female here today for follow up for migraines. She continues propranolol 216mBID and rizatriptan as needed. She feels that propranolol has helped with headache intensity. She feels that head frequency continues to be a concern. She has a headache at least 4-5 times a week but feels they are usually fairly mild. She has not needed to take rizatriptan. She will use naproxen or Tylenol as needed, especially around menses. She estimates taking OTC medications at least 1/2 of the month.   REVIEW OF SYSTEMS: Out of a complete 14 system review of symptoms, the patient complains only of the following symptoms, headaches and all other reviewed systems are negative.   ALLERGIES: No Known Allergies   HOME MEDICATIONS: Outpatient Medications Prior to Visit  Medication Sig Dispense Refill   cetirizine (ZYRTEC) 10 MG tablet Take 1 tablet (10 mg total) by mouth daily. 30 tablet 1   COVID-19 At Home Antigen Test KIT Use as directed 2 kit 0   fluticasone (FLONASE) 50 MCG/ACT nasal spray Place 2 sprays into both nostrils daily. 16 g 0    rizatriptan (MAXALT-MLT) 10 MG disintegrating tablet Take 1 tablet (10 mg total) by mouth as needed for migraine. May repeat in 2 hours if needed 9 tablet 11   influenza vac split quadrivalent PF (FLUARIX) 0.5 ML injection INJECT AS DIRECTED .5 mL 0   propranolol ER (INDERAL LA) 60 MG 24 hr capsule Take 1 capsule (60 mg total) by mouth daily. 90 capsule 3   propranolol ER (INDERAL LA) 60 MG 24 hr capsule TAKE 1 CAPSULE (60 MG TOTAL) BY MOUTH DAILY. 90 capsule 0   No facility-administered medications prior to visit.     PAST MEDICAL HISTORY: Past Medical History:  Diagnosis Date   Anxiety    on zyprexa in the past - stopped medication in 2015   Blood in stool    Chicken pox    Gestational diabetes    gestational diabetes   Hypoglycemia    has seen endocrinologist   Migraines    PVC (premature ventricular contraction)    Urinary tract infection      PAST SURGICAL HISTORY: Past Surgical History:  Procedure Laterality Date   CESAREAN SECTION     2009,2012     FAMILY HISTORY: Family History  Problem Relation Age of Onset   Healthy Mother    Healthy Father    Diabetes Paternal Grandmother    ALS Paternal Grandmother    Breast cancer Paternal Aunt    Breast cancer Maternal Grandmother [phone]     (  died in her late 2's after mets)   Heart disease Paternal Grandfather    Infertility Sister    Healthy Brother      SOCIAL HISTORY: Social History   Socioeconomic History   Marital status: Married    Spouse name: Not on file   Number of children: 2   Years of education: 20   Highest education level: Professional school degree (e.g., MD, DDS, DVM, JD)  Occupational History   Occupation: audiologist  Tobacco Use   Smoking status: Never   Smokeless tobacco: Never  Vaping Use   Vaping Use: Never used  Substance and Sexual Activity   Alcohol use: Yes    Comment: occasional   Drug use: No   Sexual activity: Not on file  Other Topics Concern   Not on file  Social  History Narrative   Work or School: Nurse, 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   Home Situation: Lives with two children and husband      Spiritual Beliefs: Catholic      Lifestyle: no regular exercise; diet is good      Caffeine: 1 cup of coffee & 1 green tea daily   Social Determinants of Radio broadcast assistant Strain: Not on file  Food Insecurity: Not on file  Transportation Needs: Not on file  Physical Activity: Not on file  Stress: Not on file  Social Connections: Not on file  Intimate Partner Violence: Not on file     PHYSICAL EXAM  Vitals:   05/27/21 1512  BP: (!) 142/87  Pulse: 63  Weight: 145 lb (65.8 kg)  Height: 5' 2" (1.575 m)   Body mass index is 26.52 kg/m.  Generalized: Well developed, in no acute distress  Cardiology: normal rate and rhythm, no murmur auscultated  Respiratory: clear to auscultation bilaterally    Neurological examination  Mentation: Alert oriented to time, place, history taking. Follows all commands speech and language fluent Cranial nerve II-XII: Pupils were equal round reactive to light. Extraocular movements were full, visual field were full on confrontational test. Facial sensation and strength were normal. Head turning and shoulder shrug  were normal and symmetric. Motor: The motor testing reveals 5 over 5 strength of all 4 extremities. Good symmetric motor tone is noted throughout.   Gait and station: Gait is normal.    DIAGNOSTIC DATA (LABS, IMAGING, TESTING) - I reviewed patient records, labs, notes, testing and imaging myself where available.  Lab Results  Component Value Date   WBC 5.2 02/04/2021   HGB 13.0 02/04/2021   HCT 37.9 02/04/2021   MCV 87.4 02/04/2021   PLT 239.0 02/04/2021      Component Value Date/Time   NA 137 02/04/2021 0847   NA 138 11/02/2019 0846   K 4.3 02/04/2021 0847   CL 106 02/04/2021 0847   CO2 24 02/04/2021 0847   GLUCOSE 93 02/04/2021 0847   BUN 12 02/04/2021 0847   BUN 8  11/02/2019 0846   CREATININE 0.69 02/04/2021 0847   CALCIUM 9.1 02/04/2021 0847   PROT 7.3 02/04/2021 0847   ALBUMIN 4.7 02/04/2021 0847   AST 12 02/04/2021 0847   ALT 10 02/04/2021 0847   ALKPHOS 54 02/04/2021 0847   BILITOT 1.0 02/04/2021 0847   GFRNONAA 110 11/02/2019 0846   GFRAA 127 11/02/2019 0846   Lab Results  Component Value Date   CHOL 164 02/04/2021   HDL 67.50 02/04/2021   LDLCALC 79 02/04/2021   TRIG 89.0 02/04/2021  CHOLHDL 2 02/04/2021   Lab Results  Component Value Date   HGBA1C 5.3 06/16/2017   No results found for: VITAMINB12 Lab Results  Component Value Date   TSH 2.06 02/04/2021    No flowsheet data found.   No flowsheet data found.   ASSESSMENT AND PLAN  40 y.o. year old female  has a past medical history of Anxiety, Blood in stool, Chicken pox, Gestational diabetes, Hypoglycemia, Migraines, PVC (premature ventricular contraction), and Urinary tract infection. here with    Migraine without aura and without status migrainosus, not intractable  Tension headache   [name] is most likely having mixed headaches. Migraines seem to be better managed, however, she continues to have regular tension style headaches concentrated around time of her menstrual cycle. She reports not sleeping very well and has some mild anxiety. We will continue propranolol LA 60mg daily for now. I will add nortriptyline 10mg at bedtime. May increase dose if well tolerated. Could consider weaning propranolol in the future if she wishes. Will consider CGRP if nortriptyline not well tolerated. We have discussed these medications in detail. Educational material provided as well. She will continue healthy lifestyle habits and continue to monitor BP at home. Follow up in 6-12 months.   No orders of the defined types were placed in this encounter.    Meds ordered this encounter  Medications   nortriptyline (PAMELOR) 10 MG capsule    Sig: Take 1 capsule (10 mg total) by mouth at  bedtime.    Dispense:  90 capsule    Refill:  3    Order Specific Question:   Supervising Provider    Answer:   AHERN, ANTONIA B [[id number]]   propranolol ER (INDERAL LA) 60 MG 24 hr capsule    Sig: TAKE 1 CAPSULE (60 MG TOTAL) BY MOUTH DAILY.    Dispense:  90 capsule    Refill:  3    Order Specific Question:   Supervising Provider    Answer:   AHERN, ANTONIA B [[id number]]        , MSN, FNP-C 05/27/2021, 4:09 PM  [facility] [address], [address] [place], [place] [address] [phone]   

## 2021-05-28 ENCOUNTER — Other Ambulatory Visit (HOSPITAL_BASED_OUTPATIENT_CLINIC_OR_DEPARTMENT_OTHER): Payer: Self-pay

## 2021-05-28 ENCOUNTER — Encounter: Payer: Self-pay | Admitting: Family Medicine

## 2021-05-29 ENCOUNTER — Other Ambulatory Visit (HOSPITAL_BASED_OUTPATIENT_CLINIC_OR_DEPARTMENT_OTHER): Payer: Self-pay

## 2021-06-03 ENCOUNTER — Encounter: Payer: Self-pay | Admitting: Family Medicine

## 2021-06-11 ENCOUNTER — Other Ambulatory Visit (HOSPITAL_BASED_OUTPATIENT_CLINIC_OR_DEPARTMENT_OTHER): Payer: Self-pay

## 2021-06-11 MED ORDER — INFLUENZA VAC SPLIT QUAD 0.5 ML IM SUSY
PREFILLED_SYRINGE | INTRAMUSCULAR | 0 refills | Status: DC
  Filled 2021-06-11: qty 0.5, 1d supply, fill #0

## 2021-06-14 ENCOUNTER — Ambulatory Visit: Payer: No Typology Code available for payment source | Admitting: Internal Medicine

## 2021-06-18 ENCOUNTER — Encounter: Payer: Self-pay | Admitting: Family Medicine

## 2021-06-18 ENCOUNTER — Other Ambulatory Visit (HOSPITAL_BASED_OUTPATIENT_CLINIC_OR_DEPARTMENT_OTHER): Payer: Self-pay

## 2021-06-18 ENCOUNTER — Telehealth (INDEPENDENT_AMBULATORY_CARE_PROVIDER_SITE_OTHER): Payer: No Typology Code available for payment source | Admitting: Family Medicine

## 2021-06-18 DIAGNOSIS — R300 Dysuria: Secondary | ICD-10-CM

## 2021-06-18 MED ORDER — NITROFURANTOIN MONOHYD MACRO 100 MG PO CAPS
100.0000 mg | ORAL_CAPSULE | Freq: Two times a day (BID) | ORAL | 0 refills | Status: DC
  Filled 2021-06-18: qty 14, 7d supply, fill #0

## 2021-06-18 NOTE — Progress Notes (Signed)
Virtual Visit via Video Note  I connected with [name]  on 06/18/21 at 12:20 PM EDT by a video enabled telemedicine application and verified that I am speaking with the correct person using two identifiers.  Location patient: home, [place] Location provider:work or home office Persons participating in the virtual visit: patient, provider  I discussed the limitations of evaluation and management by telemedicine and the availability of in person appointments. The patient expressed understanding and agreed to proceed.   HPI:  Acute telemedicine visit for Dysuria: -Onset: about 1 week ago -Symptoms include: mild dysuria, odor to urine, some frequency/urgency -Denies: fever, flank or abd pain, NVD, hematuria, melena, vaginal symptoms -Pertinent past medical history:hx of UTI, feels similar -Pertinent medication allergies: No Known Allergies -Denies any chance of pregnancy  ROS: See pertinent positives and negatives per HPI.  Past Medical History:  Diagnosis Date   Anxiety    on zyprexa in the past - stopped medication in 2015   Blood in stool    Chicken pox    Gestational diabetes    gestational diabetes   Hypoglycemia    has seen endocrinologist   Migraines    PVC (premature ventricular contraction)    Urinary tract infection     Past Surgical History:  Procedure Laterality Date   CESAREAN SECTION     2009,2012     Current Outpatient Medications:    COVID-19 At Home Antigen Test KIT, Use as directed, Disp: 2 kit, Rfl: 0   fluticasone (FLONASE) 50 MCG/ACT nasal spray, Place 2 sprays into both nostrils daily., Disp: 16 g, Rfl: 0   influenza vac split quadrivalent PF (FLUARIX) 0.5 ML injection, Inject into the muscle., Disp: 0.5 mL, Rfl: 0   nitrofurantoin, macrocrystal-monohydrate, (MACROBID) 100 MG capsule, Take 1 capsule (100 mg total) by mouth 2 (two) times daily., Disp: 14 capsule, Rfl: 0   nortriptyline (PAMELOR) 10 MG capsule, Take 1 capsule (10 mg total) by mouth at  bedtime., Disp: 90 capsule, Rfl: 3   propranolol ER (INDERAL LA) 60 MG 24 hr capsule, TAKE 1 CAPSULE (60 MG TOTAL) BY MOUTH DAILY., Disp: 90 capsule, Rfl: 3   rizatriptan (MAXALT-MLT) 10 MG disintegrating tablet, Take 1 tablet (10 mg total) by mouth as needed for migraine. May repeat in 2 hours if needed, Disp: 9 tablet, Rfl: 11  EXAM:  VITALS per patient if applicable:  GENERAL: alert, oriented, appears well and in no acute distress  HEENT: atraumatic, conjunttiva clear, no obvious abnormalities on inspection of external nose and ears  NECK: normal movements of the head and neck  LUNGS: on inspection no signs of respiratory distress, breathing rate appears normal, no obvious gross SOB, gasping or wheezing  CV: no obvious cyanosis  MS: moves all visible extremities without noticeable abnormality  PSYCH/NEURO: pleasant and cooperative, no obvious depression or anxiety, speech and thought processing grossly intact  ASSESSMENT AND PLAN:  Discussed the following assessment and plan:  Dysuria  -we discussed possible serious and likely etiologies, options for evaluation and workup, limitations of telemedicine visit vs in person visit, treatment, treatment risks and precautions. Pt is agreeable to treatment via telemedicine at this moment. Query uncomplicated cystitis vs other. She prefers to try empiric tx with Macrobid 149m bid x 7 days.  Advised to seek prompt in person care if worsening, new symptoms arise, or if is not improving with treatment. Discussed options for inperson care if PCP office not available.    I discussed the assessment and treatment plan with  the patient. The patient was provided an opportunity to ask questions and all were answered. The patient agreed with the plan and demonstrated an understanding of the instructions.     Lucretia Kern, DO

## 2021-06-18 NOTE — Patient Instructions (Signed)
-  I sent the medication(s) we discussed to your pharmacy: Meds ordered this encounter  Medications   nitrofurantoin, macrocrystal-monohydrate, (MACROBID) 100 MG capsule    Sig: Take 1 capsule (100 mg total) by mouth 2 (two) times daily.    Dispense:  14 capsule    Refill:  0     I hope you are feeling better soon!  Seek in person care promptly if your symptoms worsen, new concerns arise or you are not improving with treatment.  It was nice to meet you today. I help [facility] out with telemedicine visits on Tuesdays and Thursdays and am available for visits on those days. If you have any concerns or questions following this visit please schedule a follow up visit with your Primary Care doctor or seek care at a local urgent care clinic to avoid delays in care.   

## 2021-06-19 ENCOUNTER — Ambulatory Visit: Payer: No Typology Code available for payment source | Admitting: Cardiology

## 2021-06-19 ENCOUNTER — Encounter: Payer: Self-pay | Admitting: Cardiology

## 2021-06-19 ENCOUNTER — Other Ambulatory Visit (HOSPITAL_BASED_OUTPATIENT_CLINIC_OR_DEPARTMENT_OTHER): Payer: Self-pay

## 2021-06-19 ENCOUNTER — Other Ambulatory Visit: Payer: Self-pay

## 2021-06-19 VITALS — BP 122/84 | HR 61 | Ht 62.0 in | Wt 143.2 lb

## 2021-06-19 DIAGNOSIS — I493 Ventricular premature depolarization: Secondary | ICD-10-CM | POA: Diagnosis not present

## 2021-06-19 MED ORDER — PROPRANOLOL HCL 20 MG PO TABS
ORAL_TABLET | ORAL | 0 refills | Status: DC
  Filled 2021-06-19 – 2021-06-28 (×2): qty 21, 14d supply, fill #0

## 2021-06-19 NOTE — Patient Instructions (Signed)
Medication Instructions:  Your physician has recommended you make the following change in your medication: WEAN off your Propranolol Take 2 tabs (40 mg total) daily for one week, then Take 1 tab (20 mg total) daily for one wee, then  Stop taking this medication  *If you need a refill on your cardiac medications before your next appointment, please call your pharmacy*   Lab Work: None ordered  Testing/Procedures: None ordered   Follow-Up: At [facility], you and your health needs are our priority.  As part of our continuing mission to provide you with exceptional heart care, we have created designated Provider Care Teams.  These Care Teams include your primary Cardiologist (physician) and Advanced Practice Providers (APPs -  Physician Assistants and Nurse Practitioners) who all work together to provide you with the care you need, when you need it.   Your next appointment:     as needed  The format for your next appointment:   In Person  Provider:   Lars Mage, MD    Thank you for choosing [facility]!!

## 2021-06-19 NOTE — Progress Notes (Signed)
Electrophysiology Office Follow up Visit Note:    Date:  06/19/2021   ID:  [name], DOB 1981-06-29, MRN [id number]  PCP:  Caren Macadam, MD  [facility] Cardiologist:  Sinclair Grooms, MD  [place] Electrophysiologist:  Vickie Epley, MD    Interval History:    [name] is a 40 y.o. female who presents for a follow up visit.  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  She tells me that her PVCs were first diagnosed around the time of her pregnancy.  She was started on propranolol which seems to have controlled her PVCs.  She does not notice a significant burden of PVCs while on therapy.  She is also currently receiving treatment for anxiety and frequent migraine headaches.  Her neurologist has started nortriptyline to help manage her migraines and she is interested in coming off of the propranolol.  No significant breakthrough symptoms.       Past Medical History:  Diagnosis Date   Anxiety    on zyprexa in the past - stopped medication in 2015   Blood in stool    Chicken pox    Gestational diabetes    gestational diabetes   Hypoglycemia    has seen endocrinologist   Migraines    PVC (premature ventricular contraction)    Urinary tract infection     Past Surgical History:  Procedure Laterality Date   CESAREAN SECTION     2009,2012    Current Medications: Current Meds  Medication Sig   COVID-19 At Home Antigen Test KIT Use as directed   fluticasone (FLONASE) 50 MCG/ACT nasal spray Place 2 sprays into both nostrils daily.   influenza vac split quadrivalent PF (FLUARIX) 0.5 ML injection Inject into the muscle.   nitrofurantoin, macrocrystal-monohydrate, (MACROBID) 100 MG capsule Take 1 capsule (100 mg total) by mouth 2 (two) times daily.   nortriptyline (PAMELOR) 10 MG capsule Take 1 capsule (10 mg total) by mouth at bedtime.   rizatriptan (MAXALT-MLT) 10 MG disintegrating tablet Take 1 tablet (10 mg total) by mouth as needed for  migraine. May repeat in 2 hours if needed   [DISCONTINUED] propranolol (INDERAL) 20 MG tablet Take 2 tabs daily (40 mg) for one week, then 1 tab (38m) for one week, then stop medication   [DISCONTINUED] propranolol ER (INDERAL LA) 60 MG 24 hr capsule TAKE 1 CAPSULE (60 MG TOTAL) BY MOUTH DAILY.     Allergies:   Patient has no known allergies.   Social History   Socioeconomic History   Marital status: Married    Spouse name: Not on file   Number of children: 2   Years of education: 20   Highest education level: Professional school degree (e.g., MD, DDS, DVM, JD)  Occupational History   Occupation: audiologist  Tobacco Use   Smoking status: Never   Smokeless tobacco: Never  Vaping Use   Vaping Use: Never used  Substance and Sexual Activity   Alcohol use: Yes    Comment: occasional   Drug use: No   Sexual activity: Not on file  Other Topics Concern   Not on file  Social History Narrative   Work or School: [place]urse[place] [place]   Home Situation: Lives with two children and husband      Spiritual Beliefs: Catholic      Lifestyle: no regular exercise; diet is good      Caffeine: 1 cup  of coffee & 1 green tea daily   Social Determinants of Health   Financial Resource Strain: Not on file  Food Insecurity: Not on file  Transportation Needs: Not on file  Physical Activity: Not on file  Stress: Not on file  Social Connections: Not on file     Family History: The patient's family history includes ALS in her paternal grandmother; Breast cancer in her paternal aunt; Breast cancer (age of onset: 59) in her maternal grandmother; Diabetes in her paternal grandmother; Healthy in her brother, father, and mother; Heart disease in her paternal grandfather; Infertility in her sister.  ROS:   Please see the history of present illness.    All other systems reviewed and are negative.  EKGs/Labs/Other Studies Reviewed:    The following studies were  reviewed today:     November 28, 2019 cardiac MRI FINDINGS: 1. Normal LV size and function EF 66%  2.  No delayed gadolinium enhancement in LV myocardium  3.  Normla RV size and function RVEF 63% no RV dysplasia  4.  Mild MV thickening with Mild MR       EKG:  The ekg ordered today demonstrates sinus rhythm.  Normal intervals.  No preexcitation.  Recent Labs: 02/04/2021: ALT 10; BUN 12; Creatinine, Ser 0.69; Hemoglobin 13.0; Platelets 239.0; Potassium 4.3; Sodium 137; TSH 2.06  Recent Lipid Panel    Component Value Date/Time   CHOL 164 02/04/2021 0847   TRIG 89.0 02/04/2021 0847   HDL 67.50 02/04/2021 0847   CHOLHDL 2 02/04/2021 0847   VLDL 17.8 02/04/2021 0847   LDLCALC 79 02/04/2021 0847    Physical Exam:    VS:  BP 122/84   Pulse 61   Ht 5' 2" (1.575 m)   Wt 143 lb 3.2 oz (65 kg)   LMP 05/31/2021 (Exact Date)   SpO2 98%   BMI 26.19 kg/m     Wt Readings from Last 3 Encounters:  06/19/21 143 lb 3.2 oz (65 kg)  05/27/21 145 lb (65.8 kg)  12/14/20 142 lb 9.6 oz (64.7 kg)     GEN: Well nourished, well developed in no acute distress HEENT: Normal NECK: No JVD; No carotid bruits LYMPHATICS: No lymphadenopathy CARDIAC: RRR, no murmurs, rubs, gallops RESPIRATORY:  Clear to auscultation without rales, wheezing or rhonchi  ABDOMEN: Soft, non-tender, non-distended MUSCULOSKELETAL:  No edema; No deformity  SKIN: Warm and dry NEUROLOGIC:  Alert and oriented x 3 PSYCHIATRIC:  Normal affect        ASSESSMENT:    1. PVC's (premature ventricular contractions)    PLAN:    In order of problems listed above:   #PVCs Have been well controlled for years on propranolol.  She is interested in weaning off the propranolol which I think is very reasonable.  I advised her to take 40 mg by mouth once daily for 1 week followed by 20 mg by mouth once daily for 1 week.  She can stop the medication at that point.  If she has recurrence of PVCs, could consider restarting the  medications or consider catheter ablation.  During today's appointment we also discussed restratification for future cardiovascular events using coronary artery calcium score.  We discussed the technology and its use/limitations.  She will let us know if she would like [place] to order one of these tests for her.  For now, we will follow-up on an as-needed basis.   Total time spent with patient today 21 minutes. This includes reviewing records, evaluating the  patient and coordinating care.   Medication Adjustments/Labs and Tests Ordered: Current medicines are reviewed at length with the patient today.  Concerns regarding medicines are outlined above.  Orders Placed This Encounter  Procedures   EKG 12-Lead   Meds ordered this encounter  Medications   DISCONTD: propranolol (INDERAL) 20 MG tablet    Sig: Take 2 tabs daily (40 mg) for one week, then 1 tab (61m) for one week, then stop medication    Dispense:  21 tablet    Refill:  0     Signed, CLars Mage MD, F[facility]10/26/2022 5:37 PM    Electrophysiology [facility]

## 2021-06-20 ENCOUNTER — Ambulatory Visit: Payer: No Typology Code available for payment source | Admitting: Cardiology

## 2021-06-26 ENCOUNTER — Ambulatory Visit: Payer: No Typology Code available for payment source | Admitting: Family Medicine

## 2021-06-28 ENCOUNTER — Other Ambulatory Visit (HOSPITAL_BASED_OUTPATIENT_CLINIC_OR_DEPARTMENT_OTHER): Payer: Self-pay

## 2021-06-28 ENCOUNTER — Other Ambulatory Visit: Payer: Self-pay

## 2021-06-28 ENCOUNTER — Ambulatory Visit: Payer: No Typology Code available for payment source | Attending: Internal Medicine

## 2021-06-28 DIAGNOSIS — Z23 Encounter for immunization: Secondary | ICD-10-CM

## 2021-06-28 MED ORDER — PFIZER COVID-19 VAC BIVALENT 30 MCG/0.3ML IM SUSP
INTRAMUSCULAR | 0 refills | Status: DC
  Filled 2021-06-28: qty 0.3, 1d supply, fill #0

## 2021-06-28 NOTE — Progress Notes (Signed)
   Covid-19 Vaccination Clinic  Name:  [name]    MRN: [id number] DOB: Mar 28, 1981  06/28/2021  [name] was observed post Covid-19 immunization for 15 minutes without incident. She was provided with Vaccine Information Sheet and instruction to access the V-Safe system.   [name] was instructed to call 911 with any severe reactions post vaccine: Difficulty breathing  Swelling of face and throat  A fast heartbeat  A bad rash all over body  Dizziness and weakness   Immunizations Administered     Name Date Dose VIS Date Route   Pfizer Covid-19 Vaccine Bivalent Booster 06/28/2021  1:30 PM 0.3 mL 04/24/2021 Intramuscular   Manufacturer: [place]   Lot: [id number]   Bandana: [phone]

## 2021-07-01 ENCOUNTER — Encounter: Payer: Self-pay | Admitting: Family Medicine

## 2021-07-01 ENCOUNTER — Other Ambulatory Visit: Payer: Self-pay

## 2021-07-01 ENCOUNTER — Ambulatory Visit (INDEPENDENT_AMBULATORY_CARE_PROVIDER_SITE_OTHER): Payer: No Typology Code available for payment source | Admitting: Family Medicine

## 2021-07-01 VITALS — BP 118/68 | HR 62 | Temp 98.0°F | Ht 62.0 in | Wt 142.8 lb

## 2021-07-01 DIAGNOSIS — K644 Residual hemorrhoidal skin tags: Secondary | ICD-10-CM | POA: Diagnosis not present

## 2021-07-01 NOTE — Progress Notes (Signed)
  [name] DOB: 1980/11/01 Encounter date: 07/01/2021  This is a 40 y.o. female who presents with Chief Complaint  Patient presents with   Hemorrhoids    X8-9 years      History of present illness: Last visit with me was 02/04/2021 for physical.  No bleeding, no hurting. Just has been there a long time and hasn't gone away. Has been there since she was pregnant and just never gone away. Also knows that it just isn't supposed to be there.   At one point had anal fissure, but doesn't really recall having hemorrhoids that were flared. But does remember hearing something re hemorrhoid when pregnant.    No Known Allergies Current Meds  Medication Sig   COVID-19 At Home Antigen Test KIT Use as directed   COVID-19 mRNA bivalent vaccine, Pfizer, (PFIZER COVID-19 VAC BIVALENT) injection Inject into the muscle.   fluticasone (FLONASE) 50 MCG/ACT nasal spray Place 2 sprays into both nostrils daily.   influenza vac split quadrivalent PF (FLUARIX) 0.5 ML injection Inject into the muscle.   nortriptyline (PAMELOR) 10 MG capsule Take 1 capsule (10 mg total) by mouth at bedtime.   propranolol (INDERAL) 20 MG tablet Take 2 tabs daily (40 mg) for one week, then 1 tab (74m) for one week, then stop medication   rizatriptan (MAXALT-MLT) 10 MG disintegrating tablet Take 1 tablet (10 mg total) by mouth as needed for migraine. May repeat in 2 hours if needed    Review of Systems  Constitutional:  Negative for chills, fatigue and fever.  Respiratory:  Negative for cough, chest tightness, shortness of breath and wheezing.   Cardiovascular:  Negative for chest pain, palpitations and leg swelling.  Gastrointestinal:  Negative for anal bleeding, blood in stool, constipation, diarrhea and rectal pain.   Objective:  BP 118/68 (BP Location: Left Arm, Patient Position: Sitting, Cuff Size: Normal)   Pulse 62   Temp 98 F (36.7 C) (Oral)   Ht _0  (1.575 m)   Wt 142 lb 12.8 oz (64.8 kg)   LMP 06/25/2021    SpO2 99%   BMI 26.12 kg/m   Weight: 142 lb 12.8 oz (64.8 kg)   BP Readings from Last 3 Encounters:  07/01/21 118/68  06/19/21 122/84  05/27/21 (!) 142/87   Wt Readings from Last 3 Encounters:  07/01/21 142 lb 12.8 oz (64.8 kg)  06/19/21 143 lb 3.2 oz (65 kg)  05/27/21 145 lb (65.8 kg)    Physical Exam Constitutional:      Appearance: Normal appearance.  Genitourinary:    General: Normal vulva.     Exam position: Supine.     Rectum: External hemorrhoid present. No tenderness or anal fissure.     Comments: Ext hemorrhoids 12 and 6 oclock. Nontender.  Neurological:     Mental Status: She is alert.    Assessment/Plan 1. Residual hemorrhoidal skin tags Discussed treatment options and observation. Reasonable to get opinion for removal if patient desires; will establish care with GI as she will start screening colonoscopy in a few years regardless. Discussed hemorrhoid preventative care/management including high fiber diet.  - Ambulatory referral to Gastroenterology       JMicheline Rough MD

## 2021-07-03 ENCOUNTER — Encounter: Payer: Self-pay | Admitting: Gastroenterology

## 2021-07-05 ENCOUNTER — Encounter: Payer: Self-pay | Admitting: Family Medicine

## 2021-07-08 ENCOUNTER — Other Ambulatory Visit: Payer: Self-pay | Admitting: Family Medicine

## 2021-07-08 ENCOUNTER — Other Ambulatory Visit (HOSPITAL_BASED_OUTPATIENT_CLINIC_OR_DEPARTMENT_OTHER): Payer: Self-pay

## 2021-07-08 MED ORDER — NORTRIPTYLINE HCL 10 MG PO CAPS
20.0000 mg | ORAL_CAPSULE | Freq: Every day | ORAL | 3 refills | Status: DC
  Filled 2021-07-08: qty 180, 90d supply, fill #0

## 2021-07-30 ENCOUNTER — Other Ambulatory Visit: Payer: Self-pay | Admitting: Neurology

## 2021-07-30 ENCOUNTER — Other Ambulatory Visit (HOSPITAL_BASED_OUTPATIENT_CLINIC_OR_DEPARTMENT_OTHER): Payer: Self-pay

## 2021-07-30 MED ORDER — NORTRIPTYLINE HCL 25 MG PO CAPS
ORAL_CAPSULE | ORAL | 0 refills | Status: DC
  Filled 2021-07-30: qty 51, 29d supply, fill #0

## 2021-08-07 ENCOUNTER — Ambulatory Visit: Payer: No Typology Code available for payment source | Admitting: Gastroenterology

## 2021-08-07 ENCOUNTER — Encounter: Payer: Self-pay | Admitting: Gastroenterology

## 2021-08-07 VITALS — BP 150/84 | HR 92 | Ht 61.5 in | Wt 148.4 lb

## 2021-08-07 DIAGNOSIS — K649 Unspecified hemorrhoids: Secondary | ICD-10-CM

## 2021-08-07 NOTE — Progress Notes (Signed)
HPI: This is a very pleasant 40 year old woman who was referred to me by Caren Macadam, MD  to evaluate hemorrhoids.    She has had hemorrhoidal type bumps for at least 10 years.  She did have some rectal bleeding 10 to 15 years ago, she was told it was a fissure and the bleeding resolved.  She has not been bothered by that in at least 10 or 12 years.  The hemorrhoids have been seen by her primary care physician.  They do not bother her.  She has no itching, no bleeding, no rectal pains or anal pains.  No significant constipation or diarrhea.  Her weight is overall stable  A great aunt had colon cancer but no one else in her family   Blood work June 2022 CBC was normal, complete metabolic profile was normal.   Review of systems: Pertinent positive and negative review of systems were noted in the above HPI section. All other review negative.   Past Medical History:  Diagnosis Date   Anal fissure    Anxiety    on zyprexa in the past - stopped medication in 2015   Blood in stool    Chicken pox    Gestational diabetes    gestational diabetes   Hypoglycemia    has seen endocrinologist   Migraines    PVC (premature ventricular contraction)    Urinary tract infection     Past Surgical History:  Procedure Laterality Date   CESAREAN SECTION     x 2, 8588,5027   WISDOM TOOTH EXTRACTION      Current Outpatient Medications  Medication Sig Dispense Refill   nortriptyline (PAMELOR) 25 MG capsule Take 1 capsule (25 mg total) by mouth at bedtime for 7 days, THEN 2 capsules (50 mg total) at bedtime for 22 days. 51 capsule 0   fluticasone (FLONASE) 50 MCG/ACT nasal spray Place 2 sprays into both nostrils daily. (Patient not taking: Reported on 08/07/2021) 16 g 0   rizatriptan (MAXALT-MLT) 10 MG disintegrating tablet Take 1 tablet (10 mg total) by mouth as needed for migraine. May repeat in 2 hours if needed (Patient not taking: Reported on 08/07/2021) 9 tablet 11   No current  facility-administered medications for this visit.    Allergies as of 08/07/2021   (No Known Allergies)    Family History  Problem Relation Age of Onset   Healthy Mother    Healthy Father    Infertility Sister    Healthy Brother    Breast cancer Maternal Grandmother 20       (died in her late 61's after mets)   Esophageal cancer Maternal Grandmother    Stomach cancer Maternal Grandmother    Diabetes Paternal Grandmother    ALS Paternal Grandmother    Heart disease Paternal Grandfather    Prostate cancer Paternal Grandfather    Breast cancer Paternal Aunt    Ovarian cancer Paternal Aunt    Colon cancer Other        maternal great aunt    Social History   Socioeconomic History   Marital status: Married    Spouse name: Not on file   Number of children: 2   Years of education: 20   Highest education level: Professional school degree (e.g., MD, DDS, DVM, JD)  Occupational History   Occupation: audiologist  Tobacco Use   Smoking status: Never   Smokeless tobacco: Never  Vaping Use   Vaping Use: Never used  Substance and Sexual Activity  Alcohol use: Yes    Comment: occasional 1-2 per day   Drug use: No   Sexual activity: Not on file  Other Topics Concern   Not on file  Social History Narrative   Work or School: Nurse, 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   Home Situation: Lives with two children and husband      Spiritual Beliefs: Catholic      Lifestyle: no regular exercise; diet is good      Caffeine: 1 cup of coffee & 1 green tea daily   Social Determinants of Radio broadcast assistant Strain: Not on file  Food Insecurity: Not on file  Transportation Needs: Not on file  Physical Activity: Not on file  Stress: Not on file  Social Connections: Not on file  Intimate Partner Violence: Not on file     Physical Exam: BP (!) 150/84 (BP Location: Left Arm, Patient Position: Sitting, Cuff Size: Normal)    Pulse 92 Comment: irregular   Ht 5' 1.5"  (1.562 m) Comment: height measured without shoes   Wt 148 lb 6 oz (67.3 kg)    LMP 07/20/2021    BMI 27.58 kg/m  Constitutional: generally well-appearing Psychiatric: alert and oriented x3 Eyes: extraocular movements intact Mouth: oral pharynx moist, no lesions Neck: supple no lymphadenopathy Cardiovascular: heart regular rate and rhythm Lungs: clear to auscultation bilaterally Abdomen: soft, nontender, nondistended, no obvious ascites, no peritoneal signs, normal bowel sounds Extremities: no lower extremity edema bilaterally Skin: no lesions on visible extremities Rectal examination with female assistant in the room: Obvious deflated, nonthrombosed external hemorrhoidal tissue, 2 or 3 sites.  No anal fissures.  Digital rectal exam suggested internal hemorrhoids but no firm masses or tumors.  Stool was brown and not checked for Hemoccult.  Anoscopy exam confirmed 1 small internal anal hemorrhoid.  Assessment and plan: 40 y.o. female with hemorrhoids  We had a nice discussion about her hemorrhoids.  They are not bothering her at all.  She has no concerning symptoms and is not really inclined to have any type of procedures done for these.  She just wanted reassurance that nothing else more serious is going on and I provided her that.  She is at routine risk for colon cancer, we will put her in our reminder system for screening colonoscopy around age 12.  She knows to call here sooner if she has any questions or concerns  Please see the "Patient Instructions" section for addition details about the plan.   Owens Loffler, MD [facility] 08/07/2021, 8:36 AM  Cc: Caren Macadam, MD  Total time on date of encounter was 45 minutes (this included time spent preparing to see the patient reviewing records; obtaining and/or reviewing separately obtained history; performing a medically appropriate exam and/or evaluation; counseling and educating the patient and family if present;  ordering medications, tests or procedures if applicable; and documenting clinical information in the health record).

## 2021-08-07 NOTE — Patient Instructions (Signed)
If you are age 41 or younger, your body mass index should be between 19-25. Your Body mass index is 27.58 kg/m. If this is out of the aformentioned range listed, please consider follow up with your Primary Care Provider.   ________________________________________________________  The [place] [facility] providers would like to encourage you to use [facility] to communicate with providers for non-urgent requests or questions.  Due to long hold times on the telephone, sending your provider a message by [facility] may be a faster and more efficient way to get a response.  Please allow 48 business hours for a response.  Please remember that this is for non-urgent requests.  _______________________________________________________  Dennis Bast will be due for a colonoscopy in March 2026.  We will call you to get this scheduled.  Thank you for entrusting me with your care and choosing [facility].  Dr Ardis Hughs

## 2021-09-04 ENCOUNTER — Other Ambulatory Visit (HOSPITAL_BASED_OUTPATIENT_CLINIC_OR_DEPARTMENT_OTHER): Payer: Self-pay

## 2021-09-04 ENCOUNTER — Telehealth (INDEPENDENT_AMBULATORY_CARE_PROVIDER_SITE_OTHER): Payer: No Typology Code available for payment source | Admitting: Family Medicine

## 2021-09-04 ENCOUNTER — Other Ambulatory Visit: Payer: Self-pay

## 2021-09-04 DIAGNOSIS — N3000 Acute cystitis without hematuria: Secondary | ICD-10-CM | POA: Diagnosis not present

## 2021-09-04 MED ORDER — NITROFURANTOIN MONOHYD MACRO 100 MG PO CAPS
100.0000 mg | ORAL_CAPSULE | Freq: Two times a day (BID) | ORAL | 0 refills | Status: DC
  Filled 2021-09-04: qty 10, 5d supply, fill #0

## 2021-09-04 NOTE — Progress Notes (Signed)
Patient ID: [name], female   DOB: 02-17-1981, 41 y.o.   MRN: [id number]   This visit type was conducted due to national recommendations for restrictions regarding the COVID-19 pandemic in an effort to limit this patient's exposure and mitigate transmission in our community.   Virtual Visit via Video Note  I connected with [name] on 09/04/21 at  4:15 PM EST by a video enabled telemedicine application and verified that I am speaking with the correct person using two identifiers.  Location patient: home Location provider:work or home office Persons participating in the virtual visit: patient, provider  I discussed the limitations of evaluation and management by telemedicine and the availability of in person appointments. The patient expressed understanding and agreed to proceed.   HPI:  [name] relates onset couple days ago of some urine frequency and burning with urination.  She had exactly the same type symptoms back in October and was treated with Macrobid and symptoms promptly ceased.  No fever or chills.  No nausea or vomiting.  No flank pain.  No abdominal pain.  No gross hematuria.  No known drug allergies.   ROS: See pertinent positives and negatives per HPI.  Past Medical History:  Diagnosis Date   Anal fissure    Anxiety    on zyprexa in the past - stopped medication in 2015   Blood in stool    Chicken pox    Gestational diabetes    gestational diabetes   Hypoglycemia    has seen endocrinologist   Migraines    PVC (premature ventricular contraction)    Urinary tract infection     Past Surgical History:  Procedure Laterality Date   CESAREAN SECTION     x 2, 9211,9417   WISDOM TOOTH EXTRACTION      Family History  Problem Relation Age of Onset   Healthy Mother    Healthy Father    Infertility Sister    Healthy Brother    Breast cancer Maternal Grandmother 55       (died in her late 65's after mets)   Esophageal cancer Maternal Grandmother    Stomach  cancer Maternal Grandmother    Diabetes Paternal Grandmother    ALS Paternal Grandmother    Heart disease Paternal Grandfather    Prostate cancer Paternal Grandfather    Breast cancer Paternal Aunt    Ovarian cancer Paternal Aunt    Colon cancer Other        maternal great aunt    SOCIAL HX: Non-smoker   Current Outpatient Medications:    fluticasone (FLONASE) 50 MCG/ACT nasal spray, Place 2 sprays into both nostrils daily., Disp: 16 g, Rfl: 0   nitrofurantoin, macrocrystal-monohydrate, (MACROBID) 100 MG capsule, Take 1 capsule (100 mg total) by mouth 2 (two) times daily., Disp: 10 capsule, Rfl: 0   rizatriptan (MAXALT-MLT) 10 MG disintegrating tablet, Take 1 tablet (10 mg total) by mouth as needed for migraine. May repeat in 2 hours if needed, Disp: 9 tablet, Rfl: 11   nortriptyline (PAMELOR) 25 MG capsule, Take 1 capsule (25 mg total) by mouth at bedtime for 7 days, THEN 2 capsules (50 mg total) at bedtime for 22 days., Disp: 51 capsule, Rfl: 0  EXAM:  VITALS per patient if applicable:  GENERAL: alert, oriented, appears well and in no acute distress  HEENT: atraumatic, conjunttiva clear, no obvious abnormalities on inspection of external nose and ears  NECK: normal movements of the head and neck  LUNGS: on inspection no  signs of respiratory distress, breathing rate appears normal, no obvious gross SOB, gasping or wheezing  CV: no obvious cyanosis  MS: moves all visible extremities without noticeable abnormality  PSYCH/NEURO: pleasant and cooperative, no obvious depression or anxiety, speech and thought processing grossly intact  ASSESSMENT AND PLAN:  Discussed the following assessment and plan:  Acute cystitis without hematuria  -Start Macrobid 1 twice daily for 5 days -Plenty of fluids -Over-the-counter Azo standard as needed -Follow-up with primary for any persistent or worsening symptoms.     I discussed the assessment and treatment plan with the patient. The  patient was provided an opportunity to ask questions and all were answered. The patient agreed with the plan and demonstrated an understanding of the instructions.   The patient was advised to call back or seek an in-person evaluation if the symptoms worsen or if the condition fails to improve as anticipated.     Carolann Littler, MD

## 2021-09-17 ENCOUNTER — Other Ambulatory Visit: Payer: Self-pay

## 2021-09-18 ENCOUNTER — Other Ambulatory Visit: Payer: Self-pay | Admitting: Family Medicine

## 2021-09-18 ENCOUNTER — Other Ambulatory Visit (HOSPITAL_BASED_OUTPATIENT_CLINIC_OR_DEPARTMENT_OTHER): Payer: Self-pay

## 2021-09-18 MED ORDER — NORTRIPTYLINE HCL 50 MG PO CAPS
50.0000 mg | ORAL_CAPSULE | Freq: Every day | ORAL | 1 refills | Status: DC
  Filled 2021-09-18: qty 90, 90d supply, fill #0
  Filled 2021-11-15: qty 90, 90d supply, fill #1

## 2021-09-19 ENCOUNTER — Other Ambulatory Visit (HOSPITAL_BASED_OUTPATIENT_CLINIC_OR_DEPARTMENT_OTHER): Payer: Self-pay

## 2021-11-15 ENCOUNTER — Other Ambulatory Visit (HOSPITAL_COMMUNITY): Payer: Self-pay

## 2021-12-02 ENCOUNTER — Other Ambulatory Visit (HOSPITAL_COMMUNITY): Payer: Self-pay

## 2021-12-04 ENCOUNTER — Encounter: Payer: Self-pay | Admitting: Family Medicine

## 2021-12-04 ENCOUNTER — Other Ambulatory Visit (HOSPITAL_BASED_OUTPATIENT_CLINIC_OR_DEPARTMENT_OTHER): Payer: Self-pay

## 2021-12-04 ENCOUNTER — Ambulatory Visit (INDEPENDENT_AMBULATORY_CARE_PROVIDER_SITE_OTHER): Payer: No Typology Code available for payment source | Admitting: Family Medicine

## 2021-12-04 VITALS — BP 132/80 | HR 85 | Ht 62.0 in | Wt 147.2 lb

## 2021-12-04 DIAGNOSIS — G43009 Migraine without aura, not intractable, without status migrainosus: Secondary | ICD-10-CM

## 2021-12-04 DIAGNOSIS — G44209 Tension-type headache, unspecified, not intractable: Secondary | ICD-10-CM

## 2021-12-04 MED ORDER — NORTRIPTYLINE HCL 25 MG PO CAPS
25.0000 mg | ORAL_CAPSULE | Freq: Every day | ORAL | 3 refills | Status: DC
  Filled 2021-12-04: qty 30, 30d supply, fill #0

## 2021-12-04 MED ORDER — RIZATRIPTAN BENZOATE 10 MG PO TBDP
10.0000 mg | ORAL_TABLET | ORAL | 11 refills | Status: DC | PRN
  Filled 2021-12-04: qty 9, 15d supply, fill #0

## 2021-12-04 NOTE — Patient Instructions (Signed)
Below is our plan: ? ?We will wean nortriptyline. Start '25mg'$  daily for 1-2 weeks then decrease to '25mg'$  every other day for 1-2 weeks then stop. Consider Migrelief. You can get this online. Try rizatriptan if needed for bad headaches. Please take 1 tablet at onset of headache. May take 1 additional tablet in 2 hours if needed. Do not take more than 2 tablets in 24 hours or more than 10 in a month.  ? ?Please make sure you are staying well hydrated. I recommend 50-60 ounces daily. Well balanced diet and regular exercise encouraged. Consistent sleep schedule with 6-8 hours recommended.  ? ?Please continue follow up with care team as directed.  ? ?Follow up with me pending response.  ? ?You may receive a survey regarding today's visit. I encourage you to leave honest feed back as I do use this information to improve patient care. Thank you for seeing me today!  ? ? ?

## 2021-12-04 NOTE — Progress Notes (Signed)
? ? ?Chief Complaint  ?Patient presents with  ? Follow-up  ?  RM 12, alone. Last seen 05/27/21. Sx about the same as last visit. Does not feel med has helped. Wants to talk about discontinuing nortriptyline.  ? ? ?HISTORY OF PRESENT ILLNESS: ? ?12/04/2021 ALL:  ?Emery returns for follow up for headaches. We weaned propranolol and titrated nortriptyline to '50mg'$  QHS. We were hopeful nortriptyline would help with anxiety and sleep. She has tolerated medication but does not find it to be very effective. She is sleeping well. No change in mood. She continues to have headaches before, during and a few days following menses. Cycles are very regular. She is taking naproxen '440mg'$  daily five days prior but not sure it is helping much. Headaches are more tension style. No obvious migrainous symptoms.  ? ?12/04/21 ALL:  ?KINSLY HILD is a 41 y.o. female here today for follow up for migraines. We increased propranolol to '60mg'$  daily and continued rizatriptan as needed. She is not sure she has noted any improvement since last visit 03/2020. She does report irregular heart rate seems better but migraines seem to continue. She has near daily headaches the week prior to and week of her menstrual cycle. Headaches are usually bifrontal throbbing. Not usually associated with light or sound sensitivity. Rare nausea with really bad headache. She has difficulty staying asleep. She feels that her mind wont shut down at times. She is not on birth control. Husband had vasectomy.  ? ?04/17/20 ALL:  ?EVOLETH NORDMEYER is a 41 y.o. female here today for follow up for migraines. She continues propranolol '20mg'$  BID and rizatriptan as needed. She feels that propranolol has helped with headache intensity. She feels that head frequency continues to be a concern. She has a headache at least 4-5 times a week but feels they are usually fairly mild. She has not needed to take rizatriptan. She will use naproxen or Tylenol as needed, especially around menses. She  estimates taking OTC medications at least 1/2 of the month.  ? ?REVIEW OF SYSTEMS: Out of a complete 14 system review of symptoms, the patient complains only of the following symptoms, headaches and all other reviewed systems are negative. ? ?ALLERGIES: ?No Known Allergies ? ? ?HOME MEDICATIONS: ?Outpatient Medications Prior to Visit  ?Medication Sig Dispense Refill  ? fluticasone (FLONASE) 50 MCG/ACT nasal spray Place 2 sprays into both nostrils daily. 16 g 0  ? nortriptyline (PAMELOR) 50 MG capsule Take 1 capsule (50 mg total) by mouth at bedtime. 180 capsule 1  ? rizatriptan (MAXALT-MLT) 10 MG disintegrating tablet Take 1 tablet (10 mg total) by mouth as needed for migraine. May repeat in 2 hours if needed 9 tablet 11  ? nitrofurantoin, macrocrystal-monohydrate, (MACROBID) 100 MG capsule Take 1 capsule (100 mg total) by mouth 2 (two) times daily. 10 capsule 0  ? nortriptyline (PAMELOR) 25 MG capsule Take 1 capsule (25 mg total) by mouth at bedtime for 7 days, THEN 2 capsules (50 mg total) at bedtime for 22 days. 51 capsule 0  ? ?No facility-administered medications prior to visit.  ? ? ? ?PAST MEDICAL HISTORY: ?Past Medical History:  ?Diagnosis Date  ? Anal fissure   ? Anxiety   ? on zyprexa in the past - stopped medication in 2015  ? Blood in stool   ? Chicken pox   ? Gestational diabetes   ? gestational diabetes  ? Hypoglycemia   ? has seen endocrinologist  ? Migraines   ?  PVC (premature ventricular contraction)   ? Urinary tract infection   ? ? ? ?PAST SURGICAL HISTORY: ?Past Surgical History:  ?Procedure Laterality Date  ? CESAREAN SECTION    ? x 2, [id number]  ? WISDOM TOOTH EXTRACTION    ? ? ? ?FAMILY HISTORY: ?Family History  ?Problem Relation Age of Onset  ? Healthy Mother   ? Healthy Father   ? Infertility Sister   ? Healthy Brother   ? Breast cancer Maternal Grandmother 78  ?     (died in her late 59's after mets)  ? Esophageal cancer Maternal Grandmother   ? Stomach cancer Maternal Grandmother   ?  Diabetes Paternal Grandmother   ? ALS Paternal Grandmother   ? Heart disease Paternal Grandfather   ? Prostate cancer Paternal Grandfather   ? Breast cancer Paternal Aunt   ? Ovarian cancer Paternal Aunt   ? Colon cancer Other   ?     maternal great aunt  ? ? ? ?SOCIAL HISTORY: ?Social History  ? ?Socioeconomic History  ? Marital status: Married  ?  Spouse name: Not on file  ? Number of children: 2  ? Years of education: 46  ? Highest education level: Professional school degree (e.g., MD, DDS, DVM, JD)  ?Occupational History  ? Occupation: audiologist  ?Tobacco Use  ? Smoking status: Never  ? Smokeless tobacco: Never  ?Vaping Use  ? Vaping Use: Never used  ?Substance and Sexual Activity  ? Alcohol use: Yes  ?  Comment: occasional 1-2 per day  ? Drug use: No  ? Sexual activity: Not on file  ?Other Topics Concern  ? Not on file  ?Social History Narrative  ? 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 Lives in [place].  ? Home Situation: Lives with two children and husband  ?   ? Spiritual Beliefs: Catholic  ?   ? Lifestyle: no regular exercise; diet is good  ?   ? Caffeine: 1 cup of coffee & 1 green tea daily  ? ?Social Determinants of Health  ? ?Financial Resource Strain: Not on file  ?Food Insecurity: Not on file  ?Transportation Needs: Not on file  ?Physical Activity: Not on file  ?Stress: Not on file  ?Social Connections: Not on file  ?Intimate Partner Violence: Not on file  ? ? ? ?PHYSICAL EXAM ? ?Vitals:  ? 12/04/21 1501  ?BP: 132/80  ?Pulse: 85  ?SpO2: 99%  ?Weight: 147 lb 3.2 oz (66.8 kg)  ?Height: '5\' 2"'$  (1.575 m)  ? ? ?Body mass index is 26.92 kg/m?. ? ?Generalized: Well developed, in no acute distress ? ?Cardiology: normal rate and rhythm, no murmur auscultated  ?Respiratory: clear to auscultation bilaterally   ? ?Neurological examination  ?Mentation: Alert oriented to time, place, history taking. Follows all commands speech and language fluent ?Cranial nerve II-XII: Pupils were equal round reactive to  light. Extraocular movements were full, visual field were full on confrontational test. Facial sensation and strength were normal. Head turning and shoulder shrug  were normal and symmetric. ?Motor: The motor testing reveals 5 over 5 strength of all 4 extremities. Good symmetric motor tone is noted throughout.   ?Gait and station: Gait is normal.  ? ? ?DIAGNOSTIC DATA (LABS, IMAGING, TESTING) ?- I reviewed patient records, labs, notes, testing and imaging myself where available. ? ?Lab Results  ?Component Value Date  ? WBC 5.2 02/04/2021  ? HGB 13.0 02/04/2021  ? HCT 37.9 02/04/2021  ? MCV 87.4 02/04/2021  ?  PLT 239.0 02/04/2021  ? ?   ?Component Value Date/Time  ? NA 137 02/04/2021 0847  ? NA 138 11/02/2019 0846  ? K 4.3 02/04/2021 0847  ? CL 106 02/04/2021 0847  ? CO2 24 02/04/2021 0847  ? GLUCOSE 93 02/04/2021 0847  ? BUN 12 02/04/2021 0847  ? BUN 8 11/02/2019 0846  ? CREATININE 0.69 02/04/2021 0847  ? CALCIUM 9.1 02/04/2021 0847  ? PROT 7.3 02/04/2021 0847  ? ALBUMIN 4.7 02/04/2021 0847  ? AST 12 02/04/2021 0847  ? ALT 10 02/04/2021 0847  ? ALKPHOS 54 02/04/2021 0847  ? BILITOT 1.0 02/04/2021 0847  ? GFRNONAA 110 11/02/2019 0846  ? GFRAA 127 11/02/2019 0846  ? ?Lab Results  ?Component Value Date  ? CHOL 164 02/04/2021  ? HDL 67.50 02/04/2021  ? [place] 79 02/04/2021  ? TRIG 89.0 02/04/2021  ? CHOLHDL 2 02/04/2021  ? ?Lab Results  ?Component Value Date  ? HGBA1C 5.3 06/16/2017  ? ?No results found for: VITAMINB12 ?Lab Results  ?Component Value Date  ? TSH 2.06 02/04/2021  ? ? ?   ? View : No data to display.  ?  ?  ?  ? ? ? ?   ? View : No data to display.  ?  ?  ?  ? ? ? ?ASSESSMENT AND PLAN ? ?41 y.o. year old female  has a past medical history of Anal fissure, Anxiety, Blood in stool, Chicken pox, Gestational diabetes, Hypoglycemia, Migraines, PVC (premature ventricular contraction), and Urinary tract infection. here with  ? ? ?Migraine without aura and without status migrainosus, not intractable ? ?Tension  headache ? ?Sara is most likely having mixed headaches. She continues to have regular tension style headaches concentrated around time of her menstrual cycle. Not having migrainous symptoms. Nortriptyline has n

## 2021-12-05 ENCOUNTER — Other Ambulatory Visit (HOSPITAL_COMMUNITY): Payer: Self-pay

## 2021-12-07 ENCOUNTER — Other Ambulatory Visit (HOSPITAL_COMMUNITY): Payer: Self-pay

## 2021-12-09 ENCOUNTER — Other Ambulatory Visit (HOSPITAL_COMMUNITY): Payer: Self-pay

## 2021-12-12 ENCOUNTER — Other Ambulatory Visit: Payer: Self-pay | Admitting: Family Medicine

## 2021-12-12 DIAGNOSIS — Z1231 Encounter for screening mammogram for malignant neoplasm of breast: Secondary | ICD-10-CM

## 2021-12-27 ENCOUNTER — Encounter: Payer: Self-pay | Admitting: Family Medicine

## 2021-12-27 ENCOUNTER — Ambulatory Visit (INDEPENDENT_AMBULATORY_CARE_PROVIDER_SITE_OTHER): Payer: No Typology Code available for payment source | Admitting: Family Medicine

## 2021-12-27 VITALS — BP 136/85 | HR 85 | Temp 98.6°F | Wt 144.4 lb

## 2021-12-27 DIAGNOSIS — J302 Other seasonal allergic rhinitis: Secondary | ICD-10-CM

## 2021-12-27 DIAGNOSIS — G44209 Tension-type headache, unspecified, not intractable: Secondary | ICD-10-CM | POA: Diagnosis not present

## 2021-12-27 NOTE — Progress Notes (Signed)
Subjective:  ? ? Patient ID: [name], female    DOB: [birth year], 41 y.o.   MRN: [id number] ? ?Chief Complaint  ?Patient presents with  ? Establish Care  ?  TOC from [place].  ? ? ?HPI ?Patient is a 41 year old female with pmh sig for migraines, seasonal allergies who was seen today for TOC, previously seen by Dr. Ethlyn Gallery.  Pt states she is overall fairly healthy.  Patient endorses tension headaches days prior to menses and during.  Menses regular q 26-28 days.  Followed by neurology.  Endorses tension in neck and shoulders then headache in the center of her frontal region.  Weaning off nortriptyline as it is not effective.  Also tried propranolol in the past.  Currently taking over-the-counter supplement Migrelief to see if it will help.  Pt exercises on a rower 3-4 times per week. ? ?Patient inquires if seasonal allergies may be contributing to headaches.  At times has increased nasal congestion and drainage.  Taking OTC Zyrtec and Flonase. ? ?Past Medical History:  ?Diagnosis Date  ? Anal fissure   ? Anxiety   ? on zyprexa in the past - stopped medication in 2015  ? Blood in stool   ? Chicken pox   ? Gestational diabetes   ? gestational diabetes  ? Hypoglycemia   ? has seen endocrinologist  ? Migraines   ? PVC (premature ventricular contraction)   ? Urinary tract infection   ? ? ?No Known Allergies ? ?ROS ?General: Denies fever, chills, night sweats, changes in weight, changes in appetite ?HEENT: Denies ear pain, changes in vision, sore throat +HAs, nasal congestion, rhinorrhea ?CV: Denies CP, palpitations, SOB, orthopnea ?Pulm: Denies SOB, cough, wheezing ?GI: Denies abdominal pain, nausea, vomiting, diarrhea, constipation ?GU: Denies dysuria, hematuria, frequency, vaginal discharge ?Msk: Denies muscle cramps, joint pains ?Neuro: Denies weakness, numbness, tingling ?Skin: Denies rashes, bruising ?Psych: Denies depression, anxiety, hallucinations ? ?   ?Objective:  ?  ?Blood pressure 136/85, pulse 85,  temperature 98.6 ?F (37 ?C), temperature source Oral, weight 144 lb 6.4 oz (65.5 kg), SpO2 100 %. ? ?Gen. Pleasant, well-nourished, in no distress, normal affect   ?HEENT: [place]/AT, face symmetric, conjunctiva clear, no scleral icterus, PERRLA, EOMI, nares patent without drainage ?Lungs: no accessory muscle use, CTAB, no wheezes or rales ?Cardiovascular: RRR, no m/r/g, no peripheral edema ?Musculoskeletal: No deformities, no cyanosis or clubbing, normal tone ?Neuro:  A&Ox3, CN II-XII intact, normal gait ?Skin:  Warm, no lesions/ rash ? ? ?Wt Readings from Last 3 Encounters:  ?12/04/21 147 lb 3.2 oz (66.8 kg)  ?08/07/21 148 lb 6 oz (67.3 kg)  ?07/01/21 142 lb 12.8 oz (64.8 kg)  ? ? ?Lab Results  ?Component Value Date  ? WBC 5.2 02/04/2021  ? HGB 13.0 02/04/2021  ? HCT 37.9 02/04/2021  ? PLT 239.0 02/04/2021  ? GLUCOSE 93 02/04/2021  ? CHOL 164 02/04/2021  ? TRIG 89.0 02/04/2021  ? HDL 67.50 02/04/2021  ? [place] 79 02/04/2021  ? ALT 10 02/04/2021  ? AST 12 02/04/2021  ? NA 137 02/04/2021  ? K 4.3 02/04/2021  ? CL 106 02/04/2021  ? CREATININE 0.69 02/04/2021  ? BUN 12 02/04/2021  ? CO2 24 02/04/2021  ? TSH 2.06 02/04/2021  ? HGBA1C 5.3 06/16/2017  ? ? ?Assessment/Plan: ? ?Tension headache ?-Typically occur around menses. ?-In the past propranolol ineffective ?-continue weaning nortriptyline as effective ?-Continue OTC headache/migraine supplement Migrelief (contains Magnesium) ?-Continue rizatriptan as needed ?-Discussed other ways to relieve tension including  massage, exercise, heat, topical analgesics ?-Continue follow-up with neurology ? ?Seasonal allergies ?-currently taking Zyrtec but not as effective. ?-try a different OTC antihistamine such as Allegra, Claritin, Xyzal ?-Also consider local honey ?-Okay to continue Flonase or saline nasal rinse ? ?F/u prn for CPE in the next few months ? ?Grier Mitts, MD ?

## 2022-01-03 ENCOUNTER — Encounter: Payer: No Typology Code available for payment source | Admitting: Family Medicine

## 2022-01-10 ENCOUNTER — Other Ambulatory Visit: Payer: Self-pay | Admitting: Family Medicine

## 2022-01-10 ENCOUNTER — Ambulatory Visit
Admission: RE | Admit: 2022-01-10 | Discharge: 2022-01-10 | Disposition: A | Discharge status: Home / Self Care | Payer: No Typology Code available for payment source | Source: Ambulatory Visit | Attending: Family Medicine | Admitting: Family Medicine

## 2022-01-10 ENCOUNTER — Ambulatory Visit: Payer: No Typology Code available for payment source

## 2022-01-10 DIAGNOSIS — Z1231 Encounter for screening mammogram for malignant neoplasm of breast: Secondary | ICD-10-CM

## 2022-02-06 ENCOUNTER — Encounter: Payer: No Typology Code available for payment source | Admitting: Family Medicine

## 2022-02-28 ENCOUNTER — Other Ambulatory Visit (HOSPITAL_BASED_OUTPATIENT_CLINIC_OR_DEPARTMENT_OTHER): Payer: Self-pay

## 2022-02-28 ENCOUNTER — Ambulatory Visit (INDEPENDENT_AMBULATORY_CARE_PROVIDER_SITE_OTHER): Payer: No Typology Code available for payment source | Admitting: Family Medicine

## 2022-02-28 VITALS — BP 120/82 | HR 72 | Temp 98.3°F | Ht 61.5 in | Wt 141.8 lb

## 2022-02-28 DIAGNOSIS — Z0001 Encounter for general adult medical examination with abnormal findings: Secondary | ICD-10-CM

## 2022-02-28 DIAGNOSIS — Z1159 Encounter for screening for other viral diseases: Secondary | ICD-10-CM

## 2022-02-28 DIAGNOSIS — J302 Other seasonal allergic rhinitis: Secondary | ICD-10-CM | POA: Diagnosis not present

## 2022-02-28 DIAGNOSIS — Z0000 Encounter for general adult medical examination without abnormal findings: Secondary | ICD-10-CM

## 2022-02-28 DIAGNOSIS — Z23 Encounter for immunization: Secondary | ICD-10-CM

## 2022-02-28 DIAGNOSIS — R1033 Periumbilical pain: Secondary | ICD-10-CM | POA: Diagnosis not present

## 2022-02-28 LAB — CBC WITH DIFFERENTIAL/PLATELET
Basophils Absolute: 0.1 10*3/uL (ref 0.0–0.1)
Basophils Relative: 1.4 % (ref 0.0–3.0)
Eosinophils Absolute: 0.1 10*3/uL (ref 0.0–0.7)
Eosinophils Relative: 2.8 % (ref 0.0–5.0)
HCT: 39.2 % (ref 36.0–46.0)
Hemoglobin: 13.3 g/dL (ref 12.0–15.0)
Lymphocytes Relative: 27.7 % (ref 12.0–46.0)
Lymphs Abs: 1.4 10*3/uL (ref 0.7–4.0)
MCHC: 33.9 g/dL (ref 30.0–36.0)
MCV: 88.1 fl (ref 78.0–100.0)
Monocytes Absolute: 0.4 10*3/uL (ref 0.1–1.0)
Monocytes Relative: 8.5 % (ref 3.0–12.0)
Neutro Abs: 3 10*3/uL (ref 1.4–7.7)
Neutrophils Relative %: 59.6 % (ref 43.0–77.0)
Platelets: 267 10*3/uL (ref 150.0–400.0)
RBC: 4.45 Mil/uL (ref 3.87–5.11)
RDW: 12.8 % (ref 11.5–15.5)
WBC: 5 10*3/uL (ref 4.0–10.5)

## 2022-02-28 LAB — COMPREHENSIVE METABOLIC PANEL
ALT: 14 U/L (ref 0–35)
AST: 16 U/L (ref 0–37)
Albumin: 4.8 g/dL (ref 3.5–5.2)
Alkaline Phosphatase: 51 U/L (ref 39–117)
BUN: 8 mg/dL (ref 6–23)
CO2: 25 mEq/L (ref 19–32)
Calcium: 9.6 mg/dL (ref 8.4–10.5)
Chloride: 105 mEq/L (ref 96–112)
Creatinine, Ser: 0.64 mg/dL (ref 0.40–1.20)
GFR: 109.08 mL/min (ref >60.00)
Glucose, Bld: 89 mg/dL (ref 70–99)
Potassium: 4.1 mEq/L (ref 3.5–5.1)
Sodium: 138 mEq/L (ref 135–145)
Total Bilirubin: 0.9 mg/dL (ref 0.2–1.2)
Total Protein: 7.5 g/dL (ref 6.0–8.3)

## 2022-02-28 LAB — LIPID PANEL
Cholesterol: 162 mg/dL (ref 0–200)
HDL: 88.2 mg/dL (ref >39.00)
LDL Cholesterol: 64 mg/dL (ref 0–99)
NonHDL: 74.06
Total CHOL/HDL Ratio: 2
Triglycerides: 52 mg/dL (ref 0.0–149.0)
VLDL: 10.4 mg/dL (ref 0.0–40.0)

## 2022-02-28 LAB — TSH: TSH: 1.15 u[IU]/mL (ref 0.35–5.50)

## 2022-02-28 LAB — T4, FREE: Free T4: 1.11 ng/dL (ref 0.60–1.60)

## 2022-02-28 LAB — HEMOGLOBIN A1C: Hgb A1c MFr Bld: 5.3 % (ref 4.6–6.5)

## 2022-02-28 MED ORDER — MONTELUKAST SODIUM 10 MG PO TABS
10.0000 mg | ORAL_TABLET | Freq: Every day | ORAL | 6 refills | Status: DC
  Filled 2022-02-28: qty 30, 30d supply, fill #0

## 2022-02-28 NOTE — Progress Notes (Signed)
Subjective:     [name] is a 41 y.o. female and is here for a comprehensive physical exam. The patient reports seasonal allergies.  Tried OTC medications without relief.  Patient also notes an intraumbilical pain at times sharp with pushing.  Patient denies seeing a bulge in umbilicus.  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   Home Situation: Lives with two children and husband      Spiritual Beliefs: Catholic      Lifestyle: no regular exercise; diet is good      Caffeine: 1 cup of coffee & 1 green tea daily   Social Determinants of Health   Financial Resource Strain: Not on file  Food Insecurity: Not on file  Transportation Needs: Not on file  Physical Activity: Not on file  Stress: Not on file  Social Connections: Not on file  Intimate Partner Violence: Not on file   Health Maintenance  Topic Date Due   Hepatitis C Screening  Never done   TETANUS/TDAP  06/15/2019   HIV Screening  07/01/2025 (Originally 11/03/1994)   INFLUENZA VACCINE  03/25/2022   PAP SMEAR-Modifier  01/24/2024   COVID-19 Vaccine  Completed   HPV VACCINES  Aged Out    The following portions of the patient's history were reviewed and updated as appropriate: allergies, current medications, past family history, past medical history, past social history, past surgical history, and problem list.  Review of  Systems Pertinent items noted in HPI and remainder of comprehensive ROS otherwise negative.   Objective:    BP 120/82 (BP Location: Right Arm, Patient Position: Sitting, Cuff Size: Normal)   Pulse 72   Temp 98.3 F (36.8 C) (Oral)   Ht 5' 1.5" (1.562 m)   Wt 141 lb 12.8 oz (64.3 kg)   LMP 02/15/2022 (Approximate)   SpO2 99%   BMI 26.36 kg/m  General appearance: alert, cooperative, and no distress Head: Normocephalic, without obvious abnormality, atraumatic Eyes: conjunctivae/corneas clear. PERRL, EOM's intact. Fundi benign. Ears: normal TM's and external ear canals both ears Nose: Nares normal. Septum midline. Mucosa normal. No drainage or sinus tenderness. Throat: lips, mucosa, and tongue normal; teeth and gums normal Neck: no adenopathy, no carotid bruit, no JVD, supple, symmetrical, trachea midline, and thyroid not enlarged, symmetric, no tenderness/mass/nodules Lungs: clear to auscultation bilaterally Heart: regular rate and rhythm, S1, S2 normal, no murmur, click, rub or gallop Abdomen: soft, non-tender; bowel sounds normal; no masses,  no organomegaly and no umbilical hernia noted Extremities: extremities normal, atraumatic, no cyanosis or edema Pulses: 2+ and symmetric Skin: Skin color, texture, turgor normal. No rashes or lesions Lymph nodes: Cervical, supraclavicular, and axillary nodes normal. Neurologic: Alert and oriented X 3, normal strength and tone. Normal symmetric reflexes. Normal coordination and gait  Assessment:    Healthy female exam.      Plan:    Anticipatory guidance given including wearing seatbelts, smoke detectors in the home, increasing physical activity, increasing p.o. intake of water and vegetables. -Labs -Mammogram done 01/10/2022 -Pap done June/2023 and normal -Colonoscopy not yet indicated -Immunizations reviewed -Given handout -Next CPE in 1 year See After Visit Summary for Counseling Recommendations   Well adult exam - Plan: CBC with  Differential/Platelet, TSH, T4, Free, Hemoglobin A1c, Lipid panel, CMP  Seasonal allergies  -Can continue OTC antihistamine pills and nasal sprays if needed for additional relief - Plan: montelukast (SINGULAIR) 10 MG tablet, CBC with Differential/Platelet  Need for Tdap vaccination -Tdap given this visit  Umbilical pain  -Discussed signs and symptoms of hernia -Patient encouraged to keep navel clean and dry - Plan: CBC with Differential/Platelet  Encounter for hepatitis C screening test for low risk patient - Plan: Hep C Antibody  Follow-up as needed  Grier Mitts, MD

## 2022-03-03 LAB — HEPATITIS C ANTIBODY: Hepatitis C Ab: NONREACTIVE

## 2022-03-17 ENCOUNTER — Other Ambulatory Visit (HOSPITAL_BASED_OUTPATIENT_CLINIC_OR_DEPARTMENT_OTHER): Payer: Self-pay

## 2022-05-28 ENCOUNTER — Ambulatory Visit (INDEPENDENT_AMBULATORY_CARE_PROVIDER_SITE_OTHER): Payer: No Typology Code available for payment source | Admitting: Family Medicine

## 2022-05-28 ENCOUNTER — Other Ambulatory Visit (HOSPITAL_BASED_OUTPATIENT_CLINIC_OR_DEPARTMENT_OTHER): Payer: Self-pay

## 2022-05-28 VITALS — BP 128/78 | HR 98 | Temp 98.3°F | Ht 61.5 in | Wt 140.7 lb

## 2022-05-28 DIAGNOSIS — R300 Dysuria: Secondary | ICD-10-CM

## 2022-05-28 LAB — POC URINALSYSI DIPSTICK (AUTOMATED)
Bilirubin, UA: NEGATIVE
Blood, UA: NEGATIVE
Glucose, UA: NEGATIVE
Ketones, UA: NEGATIVE
Nitrite, UA: NEGATIVE
Protein, UA: POSITIVE — AB
Spec Grav, UA: 1.015 (ref 1.010–1.025)
Urobilinogen, UA: 0.2 E.U./dL
pH, UA: 6.5 (ref 5.0–8.0)

## 2022-05-28 MED ORDER — NITROFURANTOIN MONOHYD MACRO 100 MG PO CAPS
100.0000 mg | ORAL_CAPSULE | Freq: Two times a day (BID) | ORAL | 0 refills | Status: DC
  Filled 2022-05-28: qty 10, 5d supply, fill #0

## 2022-05-28 NOTE — Progress Notes (Signed)
Established Patient Office Visit  Subjective   Patient ID: [name], female    DOB: 22-Jul-1981  Age: 41 y.o. MRN: [id number]  Chief Complaint  Patient presents with   Dysuria    Patient complains of dysuria, x2 weeks     HPI   [name] is seen as a work in with concern for possible UTI.  She had these quite often in the past.  Some of these been treated empirically through virtual visits.  She relates about 10-day history of some urine frequency and occasional burning with urination.  No fever.  No flank pain.  No nausea or vomiting.  No gross hematuria.  She has noticed possible correlation with these being more frequent following intercourse  Past Medical History:  Diagnosis Date   Anal fissure    Anxiety    on zyprexa in the past - stopped medication in 2015   Blood in stool    Chicken pox    Gestational diabetes    gestational diabetes   Hypoglycemia    has seen endocrinologist   Migraines    PVC (premature ventricular contraction)    Urinary tract infection    Past Surgical History:  Procedure Laterality Date   CESAREAN SECTION     x 2, 2993,7169   WISDOM TOOTH EXTRACTION      reports that she has never smoked. She has never used smokeless tobacco. She reports current alcohol use. She reports that she does not use drugs. family history includes ALS in her paternal grandmother; Breast cancer in her paternal aunt; Breast cancer (age of onset: 10) in her maternal grandmother; Colon cancer in an other family member; Diabetes in her paternal grandmother; Esophageal cancer in her maternal grandmother; Healthy in her brother, father, and mother; Heart disease in her paternal grandfather; Infertility in her sister; Ovarian cancer in her paternal aunt; Prostate cancer in her paternal grandfather; Stomach cancer in her maternal grandmother. No Known Allergies  Review of Systems  Constitutional:  Negative for chills and fever.  Genitourinary:  Positive for dysuria and frequency.  Negative for flank pain and hematuria.      Objective:     BP 128/78 (BP Location: Left Arm, Cuff Size: Normal)   Pulse 98   Temp 98.3 F (36.8 C) (Oral)   Ht 5' 1.5" (1.562 m)   Wt 140 lb 11.2 oz (63.8 kg)   SpO2 100%   BMI 26.15 kg/m  BP Readings from Last 3 Encounters:  05/28/22 128/78  02/28/22 120/82  12/27/21 136/85   Wt Readings from Last 3 Encounters:  05/28/22 140 lb 11.2 oz (63.8 kg)  02/28/22 141 lb 12.8 oz (64.3 kg)  12/27/21 144 lb 6.4 oz (65.5 kg)      Physical Exam Vitals reviewed.  Constitutional:      Appearance: Normal appearance.  Cardiovascular:     Rate and Rhythm: Normal rate and regular rhythm.  Neurological:     Mental Status: She is alert.      Results for orders placed or performed in visit on 05/28/22  POCT Urinalysis Dipstick (Automated)  Result Value Ref Range   Color, UA Yellow    Clarity, UA Clear    Glucose, UA Negative Negative   Bilirubin, UA Negative    Ketones, UA Negative    Spec Grav, UA 1.015 1.010 - 1.025   Blood, UA Negative    pH, UA 6.5 5.0 - 8.0   Protein, UA Positive (A) Negative   Urobilinogen, UA  0.2 0.2 or 1.0 E.U./dL   Nitrite, UA Negative    Leukocytes, UA Trace (A) Negative      The 10-year ASCVD risk score (Arnett DK, et al., 2019) is: 0.2%    Assessment & Plan:   Problem List Items Addressed This Visit   None Visit Diagnoses     Dysuria    -  Primary   Relevant Orders   POCT Urinalysis Dipstick (Automated) (Completed)   Urine Culture     Possible UTI.  She has had several days of symptoms including frequency and burning.  Urine dipstick today shows only trace leukocytes otherwise negative.  Urine culture sent for further clarification.  Drink plenty of fluids.  Decided to cover empirically with Macrobid 1 twice daily for 5 days pending culture results  We did discuss possibility of treating prophylactically with 1 antibiotic tablet following intercourse as 1 strategy to try to reduce  future recurrence  No follow-ups on file.    Carolann Littler, MD

## 2022-05-31 LAB — URINE CULTURE
MICRO NUMBER:: 14011831
SPECIMEN QUALITY:: ADEQUATE

## 2022-06-19 ENCOUNTER — Other Ambulatory Visit (HOSPITAL_BASED_OUTPATIENT_CLINIC_OR_DEPARTMENT_OTHER): Payer: Self-pay

## 2022-06-19 MED ORDER — INFLUENZA VAC SPLIT QUAD 0.5 ML IM SUSY
PREFILLED_SYRINGE | INTRAMUSCULAR | 0 refills | Status: DC
  Filled 2022-06-19: qty 0.5, 1d supply, fill #0

## 2022-07-15 ENCOUNTER — Telehealth (INDEPENDENT_AMBULATORY_CARE_PROVIDER_SITE_OTHER): Payer: No Typology Code available for payment source | Admitting: Family Medicine

## 2022-07-15 ENCOUNTER — Encounter: Payer: Self-pay | Admitting: Family Medicine

## 2022-07-15 ENCOUNTER — Other Ambulatory Visit (HOSPITAL_BASED_OUTPATIENT_CLINIC_OR_DEPARTMENT_OTHER): Payer: Self-pay

## 2022-07-15 VITALS — Ht 61.5 in

## 2022-07-15 DIAGNOSIS — R300 Dysuria: Secondary | ICD-10-CM | POA: Diagnosis not present

## 2022-07-15 DIAGNOSIS — N390 Urinary tract infection, site not specified: Secondary | ICD-10-CM | POA: Diagnosis not present

## 2022-07-15 MED ORDER — SULFAMETHOXAZOLE-TRIMETHOPRIM 800-160 MG PO TABS
1.0000 | ORAL_TABLET | Freq: Two times a day (BID) | ORAL | 0 refills | Status: AC
  Filled 2022-07-15: qty 10, 5d supply, fill #0

## 2022-07-15 NOTE — Progress Notes (Signed)
Virtual Visit via Video Note I connected with [name] on 07/15/22 by a video enabled telemedicine application and verified that I am speaking with the correct person using two identifiers. Location patient: home Location provider:work or home office Persons participating in the virtual visit: patient, provider  I discussed the limitations of evaluation and management by telemedicine and the availability of in person appointments. The patient expressed understanding and agreed to proceed.  Chief Complaint  Patient presents with   Urinary Tract Infection    Seen in October with same symptoms    HPI: [name] is a 41 year old female with history of palpitations I am migraine headaches complaining of urinary symptoms, including "little" burning sensation, odor, and urgency, mainly in the mornings. The symptoms have been present for approximately one week. She reports that these symptoms are similar to those experienced in previous episodes, which resolved with antibiotic treatment.  She denies any associated fever, chills, abdominal pain, nausea, vomiting, back pain, vaginal discharge, or vaginal bleeding. She is sexually active and believes that the symptoms may be linked to sexual activity. There is no history of pyelonephritis or hospitalization for kidney infections.  A urine culture was performed on 05/28/22, urine culture grew E. coli 10,000-49,000 CFU and the patient was treated with an appropriate, she completed treatment with Macrobid.  ROS: See pertinent positives and negatives per HPI.  Past Medical History:  Diagnosis Date   Anal fissure    Anxiety    on zyprexa in the past - stopped medication in 2015   Blood in stool    Chicken pox    Gestational diabetes    gestational diabetes   Hypoglycemia    has seen endocrinologist   Migraines    PVC (premature ventricular contraction)    Urinary tract infection     Past Surgical History:  Procedure Laterality Date    CESAREAN SECTION     x 2, [id number]   WISDOM TOOTH EXTRACTION      Family History  Problem Relation Age of Onset   Healthy Mother    Healthy Father    Infertility Sister    Healthy Brother    Breast cancer Maternal Grandmother 71       (died in her late 60's after mets)   Esophageal cancer Maternal Grandmother    Stomach cancer Maternal Grandmother    Diabetes Paternal Grandmother    ALS Paternal Grandmother    Heart disease Paternal Grandfather    Prostate cancer Paternal Grandfather    Breast cancer Paternal Aunt    Ovarian cancer Paternal Aunt    Colon cancer Other        maternal great aunt    Social History   Socioeconomic History   Marital status: Married    Spouse name: Not on file   Number of children: 2   Years of education: 43   Highest education level: Doctorate  Occupational History   Occupation: audiologist  Tobacco Use   Smoking status: Never   Smokeless tobacco: Never  Vaping Use   Vaping Use: Never used  Substance and Sexual Activity   Alcohol use: Yes    Comment: occasional 1-2 per day   Drug use: No   Sexual activity: Not on file  Other Topics Concern   Not on file  Social History Narrative   Work or School: Nurse, 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   Home Situation: Lives with two children and husband  Spiritual Beliefs: Catholic      Lifestyle: no regular exercise; diet is good      Caffeine: 1 cup of coffee & 1 green tea daily   Social Determinants of Health   Financial Resource Strain: Low Risk  (05/28/2022)   Overall Financial Resource Strain (CARDIA)    Difficulty of Paying Living Expenses: Not hard at all  Food Insecurity: No Food Insecurity (05/28/2022)   Hunger Vital Sign    Worried About Running Out of Food in the Last Year: Never true    Ran Out of Food in the Last Year: Never true  Transportation Needs: No Transportation Needs (05/28/2022)   PRAPARE - Hydrologist (Medical): No     Lack of Transportation (Non-Medical): No  Physical Activity: Insufficiently Active (05/28/2022)   Exercise Vital Sign    Days of Exercise per Week: 3 days    Minutes of Exercise per Session: 40 min  Stress: No Stress Concern Present (05/28/2022)   [place]    Feeling of Stress : Only a little  Social Connections: Socially Integrated (05/28/2022)   Social Connection and Isolation Panel [NHANES]    Frequency of Communication with Friends and Family: Three times a week    Frequency of Social Gatherings with Friends and Family: Twice a week    Attends Religious Services: More than 4 times per year    Active Member of Clubs or Organizations: Yes    Attends Music therapist: More than 4 times per year    Marital Status: Married  Human resources officer Violence: Not on file   Current Outpatient Medications:    rizatriptan (MAXALT-MLT) 10 MG disintegrating tablet, Take 1 tablet (10 mg total) by mouth as needed for migraine. May repeat in 2 hours if needed, Disp: 9 tablet, Rfl: 11  EXAM:  VITALS per patient if applicable:Ht 5' 1.5" (0.388 m)   BMI 26.15 kg/m   GENERAL: alert, oriented, appears well and in no acute distress  HEENT: atraumatic, conjunctiva clear, no obvious abnormalities on inspection.  NECK: normal movements of the head and neck  LUNGS: on inspection no signs of respiratory distress, breathing rate appears normal, no obvious gross SOB, gasping or wheezing  CV: no obvious cyanosis  MS: moves all visible extremities without noticeable abnormality  PSYCH/NEURO: pleasant and cooperative, no obvious depression or anxiety, speech and thought processing grossly intact  ASSESSMENT AND PLAN:  Discussed the following assessment and plan: Orders Placed This Encounter  Procedures   Culture, Urine   Urinalysis with Reflex Microscopic   Dysuria She did not come to the clinic to collect a new urine  sample. We discussed possible etiologies, infectious and noninfectious. Recommend trying to avoid foods and beverages that could cause bladder irritation such as coffee, chocolate, citrus, and spicy food. Continue adequate hydration.  Recurrent UTI She was adequately treated on 05/28/2022. We will try Bactrim, to which E. coli was also sensitive to. If symptoms are persisting, she was instructed to arrange appointment with PCP, may need urology referral and/or postcoital antibiotic prophylaxis. Instructed about warning signs.  -     Sulfamethoxazole-Trimethoprim; Take 1 tablet by mouth 2 (two) times daily for 5 days.  Dispense: 10 tablet; Refill: 0  We discussed possible serious and likely etiologies, options for evaluation and workup, limitations of telemedicine visit vs in person visit, treatment, treatment risks and precautions. The patient was advised to call back or seek an in-person  evaluation if the symptoms worsen or if the condition fails to improve as anticipated. I discussed the assessment and treatment plan with the patient. The patient was provided an opportunity to ask questions and all were answered. The patient agreed with the plan and demonstrated an understanding of the instructions.  Return if symptoms worsen or fail to improve.  Lavender Stanke Martinique, MD

## 2022-10-16 ENCOUNTER — Encounter: Payer: Self-pay | Admitting: Family Medicine

## 2022-10-21 ENCOUNTER — Telehealth: Payer: Self-pay | Admitting: Family Medicine

## 2022-10-21 NOTE — Telephone Encounter (Signed)
Pt does not want to  make an appt she is having uti symptoms and just want to have order to drop off urine sample

## 2022-10-22 NOTE — Telephone Encounter (Signed)
Have pt schedule an appt.

## 2022-10-23 NOTE — Telephone Encounter (Signed)
Left a message for the patient to return my call.  

## 2022-10-23 NOTE — Telephone Encounter (Signed)
Patient has been scheduled with Dr. Martinique tomorrow

## 2022-10-24 ENCOUNTER — Encounter: Payer: Self-pay | Admitting: Family Medicine

## 2022-10-24 ENCOUNTER — Ambulatory Visit (INDEPENDENT_AMBULATORY_CARE_PROVIDER_SITE_OTHER): Payer: 59 | Admitting: Family Medicine

## 2022-10-24 VITALS — BP 118/80 | HR 76 | Temp 97.8°F | Resp 12 | Ht 61.5 in | Wt 138.0 lb

## 2022-10-24 DIAGNOSIS — R8290 Unspecified abnormal findings in urine: Secondary | ICD-10-CM | POA: Diagnosis not present

## 2022-10-24 LAB — POC URINALSYSI DIPSTICK (AUTOMATED)
Bilirubin, UA: NEGATIVE
Blood, UA: NEGATIVE
Glucose, UA: NEGATIVE
Ketones, UA: NEGATIVE
Nitrite, UA: NEGATIVE
Protein, UA: NEGATIVE
Spec Grav, UA: 1.015 (ref 1.010–1.025)
Urobilinogen, UA: 0.2 E.U./dL
pH, UA: 6 (ref 5.0–8.0)

## 2022-10-24 NOTE — Progress Notes (Signed)
ACUTE VISIT Chief Complaint  Patient presents with   Urinary Tract Infection    Having a bad odor smell in the mornings, has been going on for a few months, wondering if she needs referral to urology.   HPI: [name] is a 42 y.o. female, who is here today complaining of  malodorous urine intermittently for the past couple of months. She reports having a history of similar issues in the past that resolved with antibiotics but is concerned about the recurrence of the smell. She denies any fever, chills, abdominal pain, or other associated urinary symptoms at this time but does mention occasional burning during urination.  She has noticed a correlation between sexual activity and the onset of her symptoms in the past, usually 2-3 days after. She denies any vaginal discharge or bleeding.  Urinary Tract Infection  This is a recurrent problem. The current episode started more than 1 month ago. The problem occurs intermittently. The problem has been waxing and waning. The pain is at a severity of 0/10. The patient is experiencing no pain. There has been no fever. She is Sexually active. There is No history of pyelonephritis. Pertinent negatives include no chills, flank pain, frequency, hematuria, hesitancy, nausea, possible pregnancy, sweats, urgency or vomiting. She has tried nothing for the symptoms. There is no history of a urological procedure.   Component 4 mo ago  MICRO NUMBER: [id number]  SPECIMEN QUALITY: Adequate  Sample Source NOT GIVEN  STATUS: FINAL  ISOLATE 1: Escherichia coli Abnormal   Comment: 10,000-49,000 CFU/mL of Escherichia coli  Resulting Agency [facility] [place]     Susceptibility   Escherichia coli    URINE CULTURE, REFLEX    AMOX/CLAVULANIC <=2 Sensitive    AMPICILLIN <=2 Sensitive    AMPICILLIN/SULBACTAM <=2 Sensitive    CEFAZOLIN <=4 Not Reportable 1    CEFEPIME <=1 Sensitive    CEFTAZIDIME <=1 Sensitive    CEFTRIAXONE <=1 Sensitive     CIPROFLOXACIN <=0.25 Sensitive    GENTAMICIN <=1 Sensitive    IMIPENEM <=0.25 Sensitive    LEVOFLOXACIN <=0.12 Sensitive    NITROFURANTOIN <=16 Sensitive    PIP/TAZO <=4 Sensitive    TOBRAMYCIN <=1 Sensitive    TRIMETH/SULFA <=20 Sensitive 2                 Review of Systems  Constitutional:  Negative for activity change, appetite change, chills and fever.  Gastrointestinal:  Negative for nausea and vomiting.  Endocrine: Negative for polydipsia, polyphagia and polyuria.  Genitourinary:  Negative for dyspareunia, dysuria, flank pain, frequency, hematuria, hesitancy, pelvic pain and urgency.  Musculoskeletal:  Negative for back pain and myalgias.  Skin:  Negative for rash.  Neurological:  Negative for syncope and weakness.  See other pertinent positives and negatives in HPI.  Current Outpatient Medications on File Prior to Visit  Medication Sig Dispense Refill   rizatriptan (MAXALT-MLT) 10 MG disintegrating tablet Take 1 tablet (10 mg total) by mouth as needed for migraine. May repeat in 2 hours if needed 9 tablet 11   No current facility-administered medications on file prior to visit.   Past Medical History:  Diagnosis Date   Anal fissure    Anxiety    on zyprexa in the past - stopped medication in 2015   Blood in stool    Chicken pox    Gestational diabetes    gestational diabetes   Hypoglycemia    has seen endocrinologist   Migraines    PVC (premature  ventricular contraction)    Urinary tract infection    No Known Allergies  Social History   Socioeconomic History   Marital status: Married    Spouse name: Not on file   Number of children: 2   Years of education: 20   Highest education level: Doctorate  Occupational History   Occupation: audiologist  Tobacco Use   Smoking status: Never   Smokeless tobacco: Never  Vaping Use   Vaping Use: Never used  Substance and Sexual Activity   Alcohol use: Yes    Comment: occasional 1-2 per day   Drug use: No    Sexual activity: Not on file  Other Topics Concern   Not on file  Social History Narrative   Work or School: Nurse, 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   Home Situation: Lives with two children and husband      Spiritual Beliefs: Catholic      Lifestyle: no regular exercise; diet is good      Caffeine: 1 cup of coffee & 1 green tea daily   Social Determinants of Health   Financial Resource Strain: Low Risk  (05/28/2022)   Overall Financial Resource Strain (CARDIA)    Difficulty of Paying Living Expenses: Not hard at all  Food Insecurity: No Food Insecurity (05/28/2022)   Hunger Vital Sign    Worried About Running Out of Food in the Last Year: Never true    Ran Out of Food in the Last Year: Never true  Transportation Needs: No Transportation Needs (05/28/2022)   PRAPARE - Hydrologist (Medical): No    Lack of Transportation (Non-Medical): No  Physical Activity: Insufficiently Active (05/28/2022)   Exercise Vital Sign    Days of Exercise per Week: 3 days    Minutes of Exercise per Session: 40 min  Stress: No Stress Concern Present (05/28/2022)   [place]    Feeling of Stress : Only a little  Social Connections: Socially Integrated (05/28/2022)   Social Connection and Isolation Panel [NHANES]    Frequency of Communication with Friends and Family: Three times a week    Frequency of Social Gatherings with Friends and Family: Twice a week    Attends Religious Services: More than 4 times per year    Active Member of Clubs or Organizations: Yes    Attends Archivist Meetings: More than 4 times per year    Marital Status: Married   Vitals:   10/24/22 0702  BP: 118/80  Pulse: 76  Resp: 12  Temp: 97.8 F (36.6 C)  SpO2: 99%  Body mass index is 25.65 kg/m.  Physical Exam Vitals and nursing note reviewed.  Constitutional:      General: She is not in acute distress.     Appearance: She is well-developed.  HENT:     Head: Normocephalic and atraumatic.  Eyes:     Conjunctiva/sclera: Conjunctivae normal.  Cardiovascular:     Rate and Rhythm: Normal rate and regular rhythm.     Heart sounds: No murmur heard. Pulmonary:     Effort: Pulmonary effort is normal. No respiratory distress.     Breath sounds: Normal breath sounds.  Abdominal:     Palpations: Abdomen is soft. There is no mass.     Tenderness: There is no abdominal tenderness. There is no right CVA tenderness or left CVA tenderness.  Skin:    General: Skin is warm.  Findings: No erythema.  Neurological:     Mental Status: She is alert and oriented to person, place, and time.  Psychiatric:        Mood and Affect: Affect normal. Mood is anxious.   ASSESSMENT AND PLAN: [name] was seen today for odorous urine.  Abnormal urine odor -     POCT Urinalysis Dipstick (Automated) -     Urinalysis w microscopic + reflex cultur; Future  No other associated urinary symptoms. Urine dipstick today small amount of leukocytes, rest negative. We discussed possible causes but hx does not suggest a UTI. She would like urine sent for Cx.  Explained that urine can be contaminated during collection, so a positive urine culture does not necessarily mean a UTI. We discussed some side effects of antibiotics and the risk of bacteria resistance with frequent use. Monitor for new symptoms. Further recommendation will be given according to urine culture. Follow-up with PCP as needed.  Return if symptoms worsen or fail to improve.  Virgie Kunda G. Martinique, MD  [facility]. [place] office.

## 2022-10-24 NOTE — Patient Instructions (Addendum)
A few things to remember from today's visit:  Abnormal urine odor - Plan: POCT Urinalysis Dipstick (Automated), Urinalysis with Culture Reflex  It doe snot sound like a urine tract infection. Continue adequate hydration. Will folow culture. Monitor for new symptoms.

## 2022-10-27 LAB — URINALYSIS W MICROSCOPIC + REFLEX CULTURE
Bilirubin Urine: NEGATIVE
Glucose, UA: NEGATIVE
Hgb urine dipstick: NEGATIVE
Hyaline Cast: NONE SEEN /LPF
Ketones, ur: NEGATIVE
Nitrites, Initial: POSITIVE — AB
Protein, ur: NEGATIVE
RBC / HPF: NONE SEEN /HPF (ref 0–2)
Specific Gravity, Urine: 1.01 (ref 1.001–1.035)
pH: 6 (ref 5.0–8.0)

## 2022-10-27 LAB — URINE CULTURE
MICRO NUMBER:: 14639598
SPECIMEN QUALITY:: ADEQUATE

## 2022-10-27 LAB — CULTURE INDICATED

## 2022-10-28 ENCOUNTER — Telehealth: Payer: Self-pay | Admitting: Family Medicine

## 2022-10-28 NOTE — Telephone Encounter (Signed)
Pr seen dr Martinique on 10-24-2022 and would like urine results

## 2022-10-29 ENCOUNTER — Other Ambulatory Visit (HOSPITAL_BASED_OUTPATIENT_CLINIC_OR_DEPARTMENT_OTHER): Payer: Self-pay

## 2022-10-29 ENCOUNTER — Other Ambulatory Visit: Payer: Self-pay | Admitting: Family Medicine

## 2022-10-29 MED ORDER — NITROFURANTOIN MONOHYD MACRO 100 MG PO CAPS
100.0000 mg | ORAL_CAPSULE | Freq: Two times a day (BID) | ORAL | 0 refills | Status: AC
  Filled 2022-10-29: qty 10, 5d supply, fill #0

## 2022-10-29 NOTE — Telephone Encounter (Signed)
See result note.  

## 2022-11-20 ENCOUNTER — Other Ambulatory Visit (HOSPITAL_BASED_OUTPATIENT_CLINIC_OR_DEPARTMENT_OTHER): Payer: Self-pay

## 2022-11-20 ENCOUNTER — Telehealth: Payer: Self-pay | Admitting: Family Medicine

## 2022-11-20 DIAGNOSIS — N3020 Other chronic cystitis without hematuria: Secondary | ICD-10-CM | POA: Diagnosis not present

## 2022-11-20 MED ORDER — NITROFURANTOIN MONOHYD MACRO 100 MG PO CAPS
ORAL_CAPSULE | ORAL | 3 refills | Status: DC
  Filled 2022-11-20: qty 30, 30d supply, fill #0

## 2022-11-20 NOTE — Telephone Encounter (Signed)
Called pt back. Headaches/migraines have not improved since last visit 12/04/21. Menstrual cycle worsens them. Every 28 days, she gets two weekly. Normally takes naproxen/tylenol which helps. However, headaches constant and not helping. Last saw AL,NP 12/04/21. Has not tried rizatriptan. She wants to know if labs need to be checked. Advised it would be best for her to come in for updated visit. Scheduled for next available with Dr. Jaynee Eagles on 01/06/23 at 11am, added to wait list. Advised pt she can call periodically to check for cx as well. She verbalized understanding and appreciation.

## 2022-11-20 NOTE — Telephone Encounter (Signed)
Pt called. State she is having migraines and can't get rid of them and wanted to know if Amy could order some lab work.

## 2022-11-27 ENCOUNTER — Other Ambulatory Visit: Payer: Self-pay | Admitting: Family Medicine

## 2022-11-27 DIAGNOSIS — Z1231 Encounter for screening mammogram for malignant neoplasm of breast: Secondary | ICD-10-CM

## 2022-12-24 ENCOUNTER — Ambulatory Visit: Payer: 59 | Admitting: Neurology

## 2022-12-24 ENCOUNTER — Other Ambulatory Visit (HOSPITAL_BASED_OUTPATIENT_CLINIC_OR_DEPARTMENT_OTHER): Payer: Self-pay

## 2022-12-24 ENCOUNTER — Encounter: Payer: Self-pay | Admitting: Neurology

## 2022-12-24 VITALS — BP 150/81 | HR 98 | Ht 62.0 in | Wt 136.0 lb

## 2022-12-24 DIAGNOSIS — G43709 Chronic migraine without aura, not intractable, without status migrainosus: Secondary | ICD-10-CM

## 2022-12-24 MED ORDER — FREMANEZUMAB-VFRM 225 MG/1.5ML ~~LOC~~ SOSY
225.0000 mg | PREFILLED_SYRINGE | Freq: Once | SUBCUTANEOUS | Status: DC
Start: 2022-12-24 — End: 2023-01-13

## 2022-12-24 MED ORDER — UBRELVY 100 MG PO TABS: 100.0000 mg | ORAL_TABLET | ORAL | 0 refills | Status: DC | PRN

## 2022-12-24 MED ORDER — TOPIRAMATE 50 MG PO TABS
50.0000 mg | ORAL_TABLET | Freq: Every day | ORAL | 6 refills | Status: DC
  Filled 2022-12-24: qty 30, 30d supply, fill #0

## 2022-12-24 NOTE — Progress Notes (Signed)
Chief Complaint  Patient presents with   Follow-up    Pt in 18 Pt here  Migraine f/u Pt states 15+ headaches in last month. Pt states headache pain in forehead .  Pt states headaches increase during menstrual cycle    Addendum 12/31/2022: Patient had severe reaction to Topiramate of severe burning and tingling even at low dose. We will stop this. We will now continue to Ajovy   HISTORY OF PRESENT ILLNESS:  12/24/2022: She has migraines for the last year 24 migraine days a month, they are in the middle of her head, when moderate to severe throbbing/pulsating, nausea, light sensitivity. Last 24 hours.   Tried: nortriptyline, propranolol, topamax, rizatriptan, imitrex, aimovig contraindicated due to constipation, Topamax (side effect severe burning and tingling even at low dose)  Discussed: Topiramate at bedtime Bridge you with Ajovy and send me a mychart message about the topamax, if does not help or side effects will move to Ajovy At onset headache try samples Ubrelvy: Please take one tablet at the onset of your headache. If it does not improve the symptoms please take one additional tablet in 2 hours if effective we can prescibe Patient to see Dr. Lucia Gaskins   12/04/2021 ALL:  [place] returns for follow up for headaches. We weaned propranolol and titrated nortriptyline to 50mg  QHS. We were hopeful nortriptyline would help with anxiety and sleep. She has tolerated medication but does not find it to be very effective. She is sleeping well. No change in mood. She continues to have headaches before, during and a few days following menses. Cycles are very regular. She is taking naproxen 440mg  daily five days prior but not sure it is helping much. Headaches are more tension style. No obvious migrainous symptoms.    ALL:  [name] is a 42 y.o. female here today for follow up for migraines. We increased propranolol to 60mg  daily and continued rizatriptan as needed. She is not sure she has noted any  improvement since last visit 03/2020. She does report irregular heart rate seems better but migraines seem to continue. She has near daily headaches the week prior to and week of her menstrual cycle. Headaches are usually bifrontal throbbing. Not usually associated with light or sound sensitivity. Rare nausea with really bad headache. She has difficulty staying asleep. She feels that her mind wont shut down at times. She is not on birth control. Husband had vasectomy.   04/17/20 ALL:  [name] is a 42 y.o. female here today for follow up for migraines. She continues propranolol 20mg  BID and rizatriptan as needed. She feels that propranolol has helped with headache intensity. She feels that head frequency continues to be a concern. She has a headache at least 4-5 times a week but feels they are usually fairly mild. She has not needed to take rizatriptan. She will use naproxen or Tylenol as needed, especially around menses. She estimates taking OTC medications at least 1/2 of the month.   REVIEW OF SYSTEMS: Out of a complete 14 system review of symptoms, the patient complains only of the following symptoms, headaches and all other reviewed systems are negative.  ALLERGIES: No Known Allergies   HOME MEDICATIONS: Outpatient Medications Prior to Visit  Medication Sig Dispense Refill   Cetirizine HCl (ZYRTEC PO) Take by mouth.     nitrofurantoin, macrocrystal-monohydrate, (MACROBID) 100 MG capsule Take 1 capsule by mouth once daily immediately following intercourse, no more than once daily 30 capsule 3   Probiotic  Product (PROBIOTIC PO) Take by mouth.     rizatriptan (MAXALT-MLT) 10 MG disintegrating tablet Take 1 tablet (10 mg total) by mouth as needed for migraine. May repeat in 2 hours if needed 9 tablet 11   No facility-administered medications prior to visit.     PAST MEDICAL HISTORY: Past Medical History:  Diagnosis Date   Anal fissure    Anxiety    on zyprexa in the past - stopped  medication in 2015   Blood in stool    Chicken pox    Gestational diabetes    gestational diabetes   Hypoglycemia    has seen endocrinologist   Migraines    PVC (premature ventricular contraction)    Urinary tract infection      PAST SURGICAL HISTORY: Past Surgical History:  Procedure Laterality Date   CESAREAN SECTION     x 2, 2009,2012   WISDOM TOOTH EXTRACTION       FAMILY HISTORY: Family History  Problem Relation Age of Onset   Healthy Mother    Healthy Father    Infertility Sister    Healthy Brother    Breast cancer Paternal Aunt    Ovarian cancer Paternal Aunt    Breast cancer Maternal Grandmother 39       (died in her late 9's after mets)   Esophageal cancer Maternal Grandmother    Stomach cancer Maternal Grandmother    Diabetes Paternal Grandmother    ALS Paternal Grandmother    Heart disease Paternal Grandfather    Prostate cancer Paternal Grandfather    Colon cancer Other        maternal great aunt   Migraines Neg Hx    Headache Neg Hx      SOCIAL HISTORY: Social History   Socioeconomic History   Marital status: Married    Spouse name: Not on file   Number of children: 2   Years of education: 20   Highest education level: Doctorate  Occupational History   Occupation: audiologist  Tobacco Use   Smoking status: Never   Smokeless tobacco: Never  Vaping Use   Vaping Use: Never used  Substance and Sexual Activity   Alcohol use: Yes    Alcohol/week: 10.0 standard drinks of alcohol    Types: 10 Glasses of wine per week   Drug use: No   Sexual activity: Not on file  Other Topics Concern   Not on file  Social History Narrative   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   Home Situation: Lives with two children and husband      Spiritual Beliefs: Catholic      Lifestyle: no regular exercise; diet is good      Caffeine: 1 cup of coffee & 1 green tea daily   Social Determinants of Health   Financial Resource  Strain: Low Risk  (05/28/2022)   Overall Financial Resource Strain (CARDIA)    Difficulty of Paying Living Expenses: Not hard at all  Food Insecurity: No Food Insecurity (05/28/2022)   Hunger Vital Sign    Worried About Running Out of Food in the Last Year: Never true    Ran Out of Food in the Last Year: Never true  Transportation Needs: No Transportation Needs (05/28/2022)   PRAPARE - Administrator, Civil Service (Medical): No    Lack of Transportation (Non-Medical): No  Physical Activity: Insufficiently Active (05/28/2022)   Exercise Vital Sign    Days of Exercise per  Week: 3 days    Minutes of Exercise per Session: 40 min  Stress: No Stress Concern Present (05/28/2022)   [employer] of Occupational Health - Occupational Stress Questionnaire    Feeling of Stress : Only a little  Social Connections: Socially Integrated (05/28/2022)   Social Connection and Isolation Panel [NHANES]    Frequency of Communication with Friends and Family: Three times a week    Frequency of Social Gatherings with Friends and Family: Twice a week    Attends Religious Services: More than 4 times per year    Active Member of [employer] or Organizations: Yes    Attends Engineer, structural: More than 4 times per year    Marital Status: Married  Catering manager Violence: Not on file     PHYSICAL EXAM  Vitals:   12/24/22 0921  BP: (!) 150/81  Pulse: 98  Weight: 136 lb (61.7 kg)  Height: 5\' 2"  (1.575 m)    Body mass index is 24.87 kg/m.   Exam: NAD, pleasant                  Speech:    Speech is normal; fluent and spontaneous with normal comprehension.  Cognition:    The patient is oriented to person, place, and time;     recent and remote memory intact;     language fluent;    Cranial Nerves:    The pupils are equal, round, and reactive to light.Trigeminal sensation is intact and the muscles of mastication are normal. The face is symmetric. The palate elevates in the  midline. Hearing intact. Voice is normal. Shoulder shrug is normal. The tongue has normal motion without fasciculations.   Coordination:  No dysmetria  Motor Observation:    No asymmetry, no atrophy, and no involuntary movements noted. Tone:    Normal muscle tone.     Strength:    Strength is V/V in the upper and lower limbs.      Sensation: intact to LT   DIAGNOSTIC DATA (LABS, IMAGING, TESTING) - I reviewed patient records, labs, notes, testing and imaging myself where available.  Lab Results  Component Value Date   WBC 5.0 02/28/2022   HGB 13.3 02/28/2022   HCT 39.2 02/28/2022   MCV 88.1 02/28/2022   PLT 267.0 02/28/2022      Component Value Date/Time   NA 138 02/28/2022 1111   NA 138 11/02/2019 0846   K 4.1 02/28/2022 1111   CL 105 02/28/2022 1111   CO2 25 02/28/2022 1111   GLUCOSE 89 02/28/2022 1111   BUN 8 02/28/2022 1111   BUN 8 11/02/2019 0846   CREATININE 0.64 02/28/2022 1111   CALCIUM 9.6 02/28/2022 1111   PROT 7.5 02/28/2022 1111   ALBUMIN 4.8 02/28/2022 1111   AST 16 02/28/2022 1111   ALT 14 02/28/2022 1111   ALKPHOS 51 02/28/2022 1111   BILITOT 0.9 02/28/2022 1111   GFRNONAA 110 11/02/2019 0846   GFRAA 127 11/02/2019 0846   Lab Results  Component Value Date   CHOL 162 02/28/2022   HDL 88.20 02/28/2022   LDLCALC 64 02/28/2022   TRIG 52.0 02/28/2022   CHOLHDL 2 02/28/2022   Lab Results  Component Value Date   HGBA1C 5.3 02/28/2022   No results found for: "VITAMINB12" Lab Results  Component Value Date   TSH 1.15 02/28/2022        No data to display  No data to display           ASSESSMENT AND PLAN  42 y.o. year old female  has a past medical history of Anal fissure, Anxiety, Blood in stool, Chicken pox, Gestational diabetes, Hypoglycemia, Migraines, PVC (premature ventricular contraction), and Urinary tract infection. here with    Chronic migraine without aura without status migrainosus, not intractable -  Plan: Fremanezumab-vfrm SOSY 225 mg, Fremanezumab-vfrm (AJOVY) 225 MG/1.5ML SOAJ  Addendum 12/31/2022: Patient had severe reaction to Topiramate of severe burning and tingling even at low dose. We will stop this. We will now continue to Ajovy  Tried: nortriptyline, propranolol, topamax, rizatriptan, imitrex, aimovig contraindicated due to constipation, Topamax (side effect severe burning and tingling even at low dose cannot tolerate), ajovy  Discussed: Topiramate at bedtime: STOP severe burning and tingling, cannot tolerate. Start Ajovy At onset headache try samples Ubrelvy: Please take one tablet at the onset of your headache. If it does not improve the symptoms please take one additional tablet in 2 hours if effective we can prescibe Patient to see Dr. Lucia Gaskins   Meds ordered this encounter  Medications   topiramate (TOPAMAX) 50 MG tablet    Sig: Take 1 tablet (50 mg total) by mouth daily.    Dispense:  30 tablet    Refill:  6   Ubrogepant (UBRELVY) 100 MG TABS    Sig: Take 1 tablet (100 mg total) by mouth every 2 (two) hours as needed. Maximum 200mg  a day.    Dispense:  6 tablet    Refill:  0   Fremanezumab-vfrm SOSY 225 mg    2026-5 [id number]   Fremanezumab-vfrm (AJOVY) 225 MG/1.5ML SOAJ    Sig: Inject 225 mg into the skin every 30 (thirty) days. COPAY CARD PLEASE RUN: BIN#: [id number] PCN#: [id number] GROUP#: [id number] ID#: [id number] EXPIRES: 08/25/2023    Dispense:  1.5 mL    Refill:  11    COPAY CARD PLEASE RUN: BIN#: [id number] PCN#: [id number] GROUP#: [id number] ID#: [id number] EXPIRES: 08/25/2023       Naomie Dean MD 12/31/2022, 4:04 PM  [facility] [address] [place], [place] [address] [phone]  I spent over 40 minutes of face-to-face and non-face-to-face time with patient on the  1. Chronic migraine without aura without status migrainosus, not intractable    diagnosis.  This included previsit chart review, lab review, study review, order entry, electronic  health record documentation, patient education on the different diagnostic and therapeutic options, counseling and coordination of care, risks and benefits of management, compliance, or risk factor reduction

## 2022-12-24 NOTE — Patient Instructions (Addendum)
Topiramate at bedtime Bridge you with Ajovy and send me a mychart message about the topamax  At onset headache try samples Ubrelvy: Please take one tablet at the onset of your headache. If it does not improve the symptoms please take one additional tablet in 2 hours  Meds ordered this encounter  Medications   topiramate (TOPAMAX) 50 MG tablet    Sig: Take 1 tablet (50 mg total) by mouth daily.    Dispense:  30 tablet    Refill:  6   Ubrogepant (UBRELVY) 100 MG TABS    Sig: Take 1 tablet (100 mg total) by mouth every 2 (two) hours as needed. Maximum 200mg  a day.    Dispense:  6 tablet    Refill:  0   Fremanezumab-vfrm SOSY 225 mg    2026-5 [id number]     Fremanezumab Injection What is this medication? FREMANEZUMAB (fre ma NEZ ue mab) prevents migraines. It works by blocking a substance in the body that causes migraines. It is a monoclonal antibody. This medicine may be used for other purposes; ask your health care provider or pharmacist if you have questions. COMMON BRAND NAME(S): AJOVY What should I tell my care team before I take this medication? They need to know if you have any of these conditions: An unusual or allergic reaction to fremanezumab, other medications, foods, dyes, or preservatives Pregnant or trying to get pregnant Breast-feeding How should I use this medication? This medication is injected under the skin. You will be taught how to prepare and give it. Take it as directed on the prescription label. Keep taking it unless your care team tells you to stop. It is important that you put your used needles and syringes in a special sharps container. Do not put them in a trash can. If you do not have a sharps container, call your pharmacist or care team to get one. Talk to your care team about the use of this medication in children. Special care may be needed. Overdosage: If you think you have taken too much of this medicine contact a poison control center or emergency room at  once. NOTE: This medicine is only for you. Do not share this medicine with others. What if I miss a dose? If you miss a dose, take it as soon as you can. If it is almost time for your next dose, take only that dose. Do not take double or extra doses. What may interact with this medication? Interactions are not expected. This list may not describe all possible interactions. Give your health care provider a list of all the medicines, herbs, non-prescription drugs, or dietary supplements you use. Also tell them if you smoke, drink alcohol, or use illegal drugs. Some items may interact with your medicine. What should I watch for while using this medication? Tell your care team if your symptoms do not start to get better or if they get worse. What side effects may I notice from receiving this medication? Side effects that you should report to your care team as soon as possible: Allergic reactions or angioedema--skin rash, itching or hives, swelling of the face, eyes, lips, tongue, arms, or legs, trouble swallowing or breathing Side effects that usually do not require medical attention (report to your care team if they continue or are bothersome): Pain, redness, or irritation at injection site This list may not describe all possible side effects. Call your doctor for medical advice about side effects. You may report side effects to FDA at  1-800-FDA-1088. Where should I keep my medication? Keep out of the reach of children and pets. Store in a refrigerator or at room temperature between 20 and 25 degrees C (68 and 77 degrees F). Refrigeration (preferred): Store in the refrigerator. Do not freeze. Keep in the original container until you are ready to take it. Remove the dose from the carton about 30 minutes before it is time for you to use it. If the dose is not used, it may be stored in the original container at room temperature for 7 days. Get rid of any unused medication after the expiration date. Room  Temperature: This medication may be stored at room temperature for up to 7 days. Keep it in the original container. Protect from light until time of use. If it is stored at room temperature, get rid of any unused medication after 7 days or after it expires, whichever is first. To get rid of medications that are no longer needed or have expired: Take the medication to a medication take-back program. Check with your pharmacy or law enforcement to find a location. If you cannot return the medication, ask your pharmacist or care team how to get rid of this medication safely. NOTE: This sheet is a summary. It may not cover all possible information. If you have questions about this medicine, talk to your doctor, pharmacist, or health care provider.  2023 Elsevier/Gold Standard (2021-10-01 00:00:00) Ubrogepant Tablets What is this medication? UBROGEPANT (ue BROE je pant) treats migraines. It works by blocking a substance in the body that causes migraines. It is not used to prevent migraines. This medicine may be used for other purposes; ask your health care provider or pharmacist if you have questions. COMMON BRAND NAME(S): Bernita Raisin What should I tell my care team before I take this medication? They need to know if you have any of these conditions: Kidney disease Liver disease An unusual or allergic reaction to ubrogepant, other medications, foods, dyes, or preservatives Pregnant or trying to get pregnant Breast-feeding How should I use this medication? Take this medication by mouth with a glass of water. Take it as directed on the prescription label. You can take it with or without food. If it upsets your stomach, take it with food. Keep taking it unless your care team tells you to stop. Talk to your care team about the use of this medication in children. Special care may be needed. Overdosage: If you think you have taken too much of this medicine contact a poison control center or emergency room at  once. NOTE: This medicine is only for you. Do not share this medicine with others. What if I miss a dose? This does not apply. This medication is not for regular use. What may interact with this medication? Do not take this medication with any of the following: Adagrasib Ceritinib Certain antibiotics, such as chloramphenicol, clarithromycin, telithromycin Certain antivirals for HIV, such as atazanavir, cobicistat, darunavir, delavirdine, fosamprenavir, indinavir, ritonavir Certain medications for fungal infections, such as itraconazole, ketoconazole, posaconazole, voriconazole Conivaptan Grapefruit Idelalisib Mifepristone Nefazodone Ribociclib This medication may also interact with the following: Carvedilol Certain medications for seizures, such as phenobarbital, phenytoin Ciprofloxacin Cyclosporine Eltrombopag Fluconazole Fluvoxamine Quinidine Rifampin St. John's wort Verapamil This list may not describe all possible interactions. Give your health care provider a list of all the medicines, herbs, non-prescription drugs, or dietary supplements you use. Also tell them if you smoke, drink alcohol, or use illegal drugs. Some items may interact with your medicine. What should  I watch for while using this medication? Visit your care team for regular checks on your progress. Tell your care team if your symptoms do not start to get better or if they get worse. Your mouth may get dry. Chewing sugarless gum or sucking hard candy and drinking plenty of water may help. Contact your care team if the problem does not go away or is severe. What side effects may I notice from receiving this medication? Side effects that you should report to your care team as soon as possible: Allergic reactions--skin rash, itching, hives, swelling of the face, lips, tongue, or throat Side effects that usually do not require medical attention (report to your care team if they continue or are  bothersome): Drowsiness Dry mouth Fatigue Nausea This list may not describe all possible side effects. Call your doctor for medical advice about side effects. You may report side effects to FDA at 1-800-FDA-1088. Where should I keep my medication? Keep out of the reach of children and pets. Store between 15 and 30 degrees C (59 and 86 degrees F). Get rid of any unused medication after the expiration date. To get rid of medications that are no longer needed or have expired: Take the medication to a medication take-back program. Check with your pharmacy or law enforcement to find a location. If you cannot return the medication, check the label or package insert to see if the medication should be thrown out in the garbage or flushed down the toilet. If you are not sure, ask your care team. If it is safe to put it in the trash, pour the medication out of the container. Mix the medication with cat litter, dirt, coffee grounds, or other unwanted substance. Seal the mixture in a bag or container. Put it in the trash. NOTE: This sheet is a summary. It may not cover all possible information. If you have questions about this medicine, talk to your doctor, pharmacist, or health care provider.  2023 Elsevier/Gold Standard (2021-09-19 00:00:00) Topiramate Tablets What is this medication? TOPIRAMATE (toe PYRE a mate) prevents and controls seizures in people with epilepsy. It may also be used to prevent migraine headaches. It works by calming overactive nerves in your body. This medicine may be used for other purposes; ask your health care provider or pharmacist if you have questions. COMMON BRAND NAME(S): Topamax, Topiragen What should I tell my care team before I take this medication? They need to know if you have any of these conditions: Bleeding disorder Kidney disease Lung disease Suicidal thoughts, plans, or attempt by you or a family member An unusual or allergic reaction to topiramate, other  medications, foods, dyes, or preservatives Pregnant or trying to get pregnant Breast-feeding How should I use this medication? Take this medication by mouth with water. Take it as directed on the prescription label at the same time every day. Do not cut, crush or chew this medicine. Swallow the tablets whole. You can take it with or without food. If it upsets your stomach, take it with food. Keep taking it unless your care team tells you to stop. A special MedGuide will be given to you by the pharmacist with each prescription and refill. Be sure to read this information carefully each time. Talk to your care team about the use of this medication in children. While it may be prescribed for children as young as 2 years for selected conditions, precautions do apply. Overdosage: If you think you have taken too much of this medicine  contact a poison control center or emergency room at once. NOTE: This medicine is only for you. Do not share this medicine with others. What if I miss a dose? If you miss a dose, take it as soon as you can unless it is within 6 hours of the next dose. If it is within 6 hours of the next dose, skip the missed dose. Take the next dose at the normal time. Do not take double or extra doses. What may interact with this medication? Acetazolamide Alcohol Antihistamines for allergy, cough, and cold Aspirin and aspirin-like medications Atropine Certain medications for anxiety or sleep Certain medications for bladder problems, such as oxybutynin, tolterodine Certain medications for depression, such as amitriptyline, fluoxetine, sertraline Certain medications for Parkinson disease, such as benztropine, trihexyphenidyl Certain medications for seizures, such as carbamazepine, lamotrigine, phenobarbital, phenytoin, primidone, valproic acid, zonisamide Certain medications for stomach problems, such as dicyclomine, hyoscyamine Certain medications for travel sickness, such as  scopolamine Certain medications that treat or prevent blood clots, such as warfarin, enoxaparin, dalteparin, apixaban, dabigatran, rivaroxaban Digoxin Diltiazem Estrogen and progestin hormones General anesthetics, such as halothane, isoflurane, methoxyflurane, propofol Glyburide Hydrochlorothiazide Ipratropium Lithium Medications that relax muscles Metformin NSAIDs, medications for pain and inflammation, such as ibuprofen or naproxen Opioid medications for pain Phenothiazines, such as chlorpromazine, mesoridazine, prochlorperazine, thioridazine Pioglitazone This list may not describe all possible interactions. Give your health care provider a list of all the medicines, herbs, non-prescription drugs, or dietary supplements you use. Also tell them if you smoke, drink alcohol, or use illegal drugs. Some items may interact with your medicine. What should I watch for while using this medication? Visit your care team for regular checks on your progress. Tell your care team if your symptoms do not start to get better or if they get worse. Do not suddenly stop taking this medication. You may develop a severe reaction. Your care team will tell you how much medication to take. If your care team wants you to stop the medication, the dose may be slowly lowered over time to avoid any side effects. Wear a medical ID bracelet or chain. Carry a card that describes your condition. List the medications and doses you take on the card. This medication may affect your coordination, reaction time, or judgment. Do not drive or operate machinery until you know how this medication affects you. Sit up or stand slowly to reduce the risk of dizzy or fainting spells. Drinking alcohol with this medication can increase the risk of these side effects. This medication may cause serious skin reactions. They can happen weeks to months after starting the medication. Contact your care team right away if you notice fevers or  flu-like symptoms with a rash. The rash may be red or purple and then turn into blisters or peeling of the skin. You may also notice a red rash with swelling of the face, lips, or lymph nodes in your neck or under your arms. This medication may cause thoughts of suicide or depression. This includes sudden changes in mood, behaviors, or thoughts. These changes can happen at any time but are more common in the beginning of treatment or after a change in dose. Call your care team right away if you experience these thoughts or worsening depression. This medication may slow your child's growth if it is taken for a long time at high doses. Your child's care team will monitor your child's growth. Using this medication for a long time may weaken your bones. The risk  of bone fractures may be increased. Talk to your care team about your bone health. Discuss this medication with your care team if you may be pregnant. Serious birth defects can occur if you take this medication during pregnancy. There are benefits and risks to taking medications during pregnancy. Your care team can help you find the option that works for you. Contraception is recommended while taking this medication. Estrogen and progestin hormones may not work as well while you are taking this medication. Your care team can help you find the option that works for you. Talk to your care team before breastfeeding. Changes to your treatment plan may be needed. What side effects may I notice from receiving this medication? Side effects that you should report to your care team as soon as possible: Allergic reactions--skin rash, itching, hives, swelling of the face, lips, tongue, or throat High acid level--trouble breathing, unusual weakness or fatigue, confusion, headache, fast or irregular heartbeat, nausea, vomiting High ammonia level--unusual weakness or fatigue, confusion, loss of appetite, nausea, vomiting, seizures Fever that does not go away,  decrease in sweat Kidney stones--blood in the urine, pain or trouble passing urine, pain in the lower back or sides Redness, blistering, peeling or loosening of the skin, including inside the mouth Sudden eye pain or change in vision such as blurry vision, seeing halos around lights, vision loss Thoughts of suicide or self-harm, worsening mood, feelings of depression Side effects that usually do not require medical attention (report to your care team if they continue or are bothersome): Burning or tingling sensation in hands or feet Difficulty with paying attention, memory, or speech Dizziness Drowsiness Fatigue Loss of appetite with weight loss Slow or sluggish movements of the body This list may not describe all possible side effects. Call your doctor for medical advice about side effects. You may report side effects to FDA at 1-800-FDA-1088. Where should I keep my medication? Keep out of the reach of children and pets. Store between 15 and 30 degrees C (59 and 86 degrees F). Protect from moisture. Keep the container tightly closed. Get rid of any unused medication after the expiration date. To get rid of medications that are no longer needed or have expired: Take the medication to a medication take-back program. Check with your pharmacy or law enforcement to find a location. If you cannot return the medication, check the label or package insert to see if the medication should be thrown out in the garbage or flushed down the toilet. If you are not sure, ask your care team. If it is safe to put it in the trash, empty the medication out of the container. Mix the medication with cat litter, dirt, coffee grounds, or other unwanted substance. Seal the mixture in a bag or container. Put it in the trash. NOTE: This sheet is a summary. It may not cover all possible information. If you have questions about this medicine, talk to your doctor, pharmacist, or health care provider.  2023 Elsevier/Gold  Standard (2021-12-31 00:00:00)

## 2022-12-28 ENCOUNTER — Encounter: Payer: Self-pay | Admitting: Neurology

## 2022-12-28 DIAGNOSIS — G43709 Chronic migraine without aura, not intractable, without status migrainosus: Secondary | ICD-10-CM

## 2022-12-31 ENCOUNTER — Other Ambulatory Visit (HOSPITAL_BASED_OUTPATIENT_CLINIC_OR_DEPARTMENT_OTHER): Payer: Self-pay

## 2022-12-31 MED ORDER — AJOVY 225 MG/1.5ML ~~LOC~~ SOAJ
225.0000 mg | SUBCUTANEOUS | 11 refills | Status: DC
Start: 2022-12-31 — End: 2023-01-12
  Filled 2022-12-31 – 2023-01-01 (×2): qty 1.5, 30d supply, fill #0
  Filled 2023-01-09: qty 1.5, 30d supply, fill #1

## 2022-12-31 NOTE — Addendum Note (Signed)
Addended by: Naomie Dean B on: 12/31/2022 04:05 PM   Modules accepted: Orders

## 2023-01-01 ENCOUNTER — Other Ambulatory Visit (HOSPITAL_BASED_OUTPATIENT_CLINIC_OR_DEPARTMENT_OTHER): Payer: Self-pay

## 2023-01-01 ENCOUNTER — Telehealth: Payer: Self-pay | Admitting: *Deleted

## 2023-01-01 ENCOUNTER — Other Ambulatory Visit: Payer: Self-pay

## 2023-01-01 NOTE — Telephone Encounter (Signed)
Spoke with [facility] pharmacy. They were able to get the copay card to go through for $15. Ajovy will be brought in from their off site warehouse and will be ready for pickup there after 430 pm. I updated the patient. A PA is pending.

## 2023-01-01 NOTE — Telephone Encounter (Signed)
Completed Ajovy PA on CMM. Key: [id number]. Awaiting determination from [employer].

## 2023-01-05 ENCOUNTER — Other Ambulatory Visit (HOSPITAL_BASED_OUTPATIENT_CLINIC_OR_DEPARTMENT_OTHER): Payer: Self-pay

## 2023-01-06 ENCOUNTER — Ambulatory Visit: Payer: 59 | Admitting: Neurology

## 2023-01-09 ENCOUNTER — Other Ambulatory Visit (HOSPITAL_BASED_OUTPATIENT_CLINIC_OR_DEPARTMENT_OTHER): Payer: Self-pay

## 2023-01-12 ENCOUNTER — Ambulatory Visit
Admission: RE | Admit: 2023-01-12 | Discharge: 2023-01-12 | Disposition: A | Discharge status: Home / Self Care | Payer: 59 | Source: Ambulatory Visit | Attending: Family Medicine | Admitting: Family Medicine

## 2023-01-12 DIAGNOSIS — Z1231 Encounter for screening mammogram for malignant neoplasm of breast: Secondary | ICD-10-CM

## 2023-01-12 MED ORDER — AJOVY 225 MG/1.5ML ~~LOC~~ SOAJ
225.0000 mg | SUBCUTANEOUS | 0 refills | Status: DC
Start: 2023-01-12 — End: 2023-03-16

## 2023-01-12 NOTE — Addendum Note (Signed)
Addended by: Bertram Savin on: 01/12/2023 05:29 PM   Modules accepted: Orders

## 2023-01-12 NOTE — Telephone Encounter (Signed)
Verbal order placed for Ajovy 225 mg sample and the injection is ready for patient to pickup.

## 2023-01-13 ENCOUNTER — Other Ambulatory Visit (HOSPITAL_BASED_OUTPATIENT_CLINIC_OR_DEPARTMENT_OTHER): Payer: Self-pay

## 2023-01-13 ENCOUNTER — Other Ambulatory Visit: Payer: Self-pay | Admitting: *Deleted

## 2023-01-13 DIAGNOSIS — D2261 Melanocytic nevi of right upper limb, including shoulder: Secondary | ICD-10-CM | POA: Diagnosis not present

## 2023-01-13 DIAGNOSIS — L918 Other hypertrophic disorders of the skin: Secondary | ICD-10-CM | POA: Diagnosis not present

## 2023-01-13 DIAGNOSIS — D2272 Melanocytic nevi of left lower limb, including hip: Secondary | ICD-10-CM | POA: Diagnosis not present

## 2023-01-13 DIAGNOSIS — D2271 Melanocytic nevi of right lower limb, including hip: Secondary | ICD-10-CM | POA: Diagnosis not present

## 2023-01-13 DIAGNOSIS — D2362 Other benign neoplasm of skin of left upper limb, including shoulder: Secondary | ICD-10-CM | POA: Diagnosis not present

## 2023-01-13 DIAGNOSIS — D225 Melanocytic nevi of trunk: Secondary | ICD-10-CM | POA: Diagnosis not present

## 2023-01-13 DIAGNOSIS — L700 Acne vulgaris: Secondary | ICD-10-CM | POA: Diagnosis not present

## 2023-01-13 MED ORDER — EMGALITY 120 MG/ML ~~LOC~~ SOAJ
120.0000 mg | SUBCUTANEOUS | 3 refills | Status: DC
  Filled 2023-01-13: qty 1, 30d supply, fill #0
  Filled 2023-03-27: qty 1, 30d supply, fill #1
  Filled 2023-05-04: qty 1, 30d supply, fill #2
  Filled 2023-05-28 – 2023-05-29 (×2): qty 1, 30d supply, fill #3

## 2023-01-13 NOTE — Telephone Encounter (Signed)
Sample picked up. Front desk verified patient name/dob.

## 2023-01-20 ENCOUNTER — Other Ambulatory Visit (HOSPITAL_BASED_OUTPATIENT_CLINIC_OR_DEPARTMENT_OTHER): Payer: Self-pay

## 2023-02-05 ENCOUNTER — Other Ambulatory Visit (HOSPITAL_BASED_OUTPATIENT_CLINIC_OR_DEPARTMENT_OTHER): Payer: Self-pay

## 2023-02-05 DIAGNOSIS — Z01419 Encounter for gynecological examination (general) (routine) without abnormal findings: Secondary | ICD-10-CM | POA: Diagnosis not present

## 2023-02-05 DIAGNOSIS — N946 Dysmenorrhea, unspecified: Secondary | ICD-10-CM | POA: Diagnosis not present

## 2023-02-05 DIAGNOSIS — Z6824 Body mass index (BMI) 24.0-24.9, adult: Secondary | ICD-10-CM | POA: Diagnosis not present

## 2023-02-05 MED ORDER — IBUPROFEN 800 MG PO TABS
800.0000 mg | ORAL_TABLET | Freq: Three times a day (TID) | ORAL | 3 refills | Status: DC
  Filled 2023-02-05: qty 30, 10d supply, fill #0
  Filled 2023-08-27: qty 30, 10d supply, fill #1

## 2023-02-09 ENCOUNTER — Other Ambulatory Visit (HOSPITAL_BASED_OUTPATIENT_CLINIC_OR_DEPARTMENT_OTHER): Payer: Self-pay

## 2023-02-09 MED ORDER — UBRELVY 100 MG PO TABS
100.0000 mg | ORAL_TABLET | ORAL | 11 refills | Status: DC | PRN
  Filled 2023-02-09: qty 10, 30d supply, fill #0

## 2023-02-09 NOTE — Telephone Encounter (Signed)
Bernita Raisin prescription started/ pended for you so sign off.  Worked well for her.

## 2023-02-09 NOTE — Addendum Note (Signed)
Addended by: Guy Begin on: 02/09/2023 08:52 AM   Modules accepted: Orders

## 2023-02-11 ENCOUNTER — Other Ambulatory Visit (HOSPITAL_BASED_OUTPATIENT_CLINIC_OR_DEPARTMENT_OTHER): Payer: Self-pay

## 2023-02-11 ENCOUNTER — Other Ambulatory Visit: Payer: Self-pay | Admitting: *Deleted

## 2023-02-11 MED ORDER — UBRELVY 100 MG PO TABS
100.0000 mg | ORAL_TABLET | ORAL | 11 refills | Status: DC | PRN
  Filled 2023-02-11: qty 16, 8d supply, fill #0
  Filled 2023-02-23 – 2023-03-19 (×2): qty 16, 8d supply, fill #1
  Filled 2023-04-08: qty 16, 30d supply, fill #2
  Filled 2023-06-09: qty 16, 30d supply, fill #3
  Filled 2023-08-27: qty 16, 30d supply, fill #4

## 2023-02-11 NOTE — Telephone Encounter (Signed)
Spoke to patient sent Ubrevly prescription to pharmacy this am

## 2023-02-21 ENCOUNTER — Other Ambulatory Visit (HOSPITAL_COMMUNITY): Payer: Self-pay

## 2023-02-21 ENCOUNTER — Telehealth: Payer: Self-pay

## 2023-02-21 NOTE — Telephone Encounter (Signed)
Pharmacy Patient Advocate Encounter   Received notification from [employer] that prior authorization for Ubrelvy 100MG  tablets is required/requested.   PA submitted to [employer] via CoverMyMeds Key or (Medicaid) confirmation # [id number] Status is pending

## 2023-02-23 ENCOUNTER — Other Ambulatory Visit (HOSPITAL_COMMUNITY): Payer: Self-pay

## 2023-02-23 ENCOUNTER — Other Ambulatory Visit (HOSPITAL_BASED_OUTPATIENT_CLINIC_OR_DEPARTMENT_OTHER): Payer: Self-pay

## 2023-02-23 NOTE — Telephone Encounter (Signed)
Pharmacy Patient Advocate Encounter  Prior Authorization for Bernita Raisin 100MG  tablets has been APPROVED by [facility] from 02/22/2023 to 08/21/2023.   PA # PA Case ID: [id number]  Copay is $0 per [facility] test claim.

## 2023-03-06 ENCOUNTER — Encounter: Payer: No Typology Code available for payment source | Admitting: Family Medicine

## 2023-03-06 DIAGNOSIS — H5213 Myopia, bilateral: Secondary | ICD-10-CM | POA: Diagnosis not present

## 2023-03-06 DIAGNOSIS — H52223 Regular astigmatism, bilateral: Secondary | ICD-10-CM | POA: Diagnosis not present

## 2023-03-06 DIAGNOSIS — H04123 Dry eye syndrome of bilateral lacrimal glands: Secondary | ICD-10-CM | POA: Diagnosis not present

## 2023-03-06 DIAGNOSIS — H524 Presbyopia: Secondary | ICD-10-CM | POA: Diagnosis not present

## 2023-03-10 ENCOUNTER — Other Ambulatory Visit (HOSPITAL_BASED_OUTPATIENT_CLINIC_OR_DEPARTMENT_OTHER): Payer: Self-pay

## 2023-03-13 ENCOUNTER — Encounter: Payer: Self-pay | Admitting: Neurology

## 2023-03-16 ENCOUNTER — Ambulatory Visit (INDEPENDENT_AMBULATORY_CARE_PROVIDER_SITE_OTHER): Payer: 59 | Admitting: Family Medicine

## 2023-03-16 ENCOUNTER — Encounter: Payer: Self-pay | Admitting: Family Medicine

## 2023-03-16 VITALS — BP 138/86 | HR 70 | Temp 98.9°F | Ht 62.0 in | Wt 138.6 lb

## 2023-03-16 DIAGNOSIS — Z0000 Encounter for general adult medical examination without abnormal findings: Secondary | ICD-10-CM | POA: Diagnosis not present

## 2023-03-16 DIAGNOSIS — G43709 Chronic migraine without aura, not intractable, without status migrainosus: Secondary | ICD-10-CM | POA: Diagnosis not present

## 2023-03-16 LAB — COMPREHENSIVE METABOLIC PANEL
ALT: 9 U/L (ref 0–35)
AST: 14 U/L (ref 0–37)
Albumin: 4.5 g/dL (ref 3.5–5.2)
Alkaline Phosphatase: 61 U/L (ref 39–117)
BUN: 13 mg/dL (ref 6–23)
CO2: 22 mEq/L (ref 19–32)
Calcium: 9.3 mg/dL (ref 8.4–10.5)
Chloride: 104 mEq/L (ref 96–112)
Creatinine, Ser: 0.64 mg/dL (ref 0.40–1.20)
GFR: 108.28 mL/min (ref >60.00)
Glucose, Bld: 100 mg/dL — ABNORMAL HIGH (ref 70–99)
Potassium: 3.9 mEq/L (ref 3.5–5.1)
Sodium: 138 mEq/L (ref 135–145)
Total Bilirubin: 1 mg/dL (ref 0.2–1.2)
Total Protein: 7.3 g/dL (ref 6.0–8.3)

## 2023-03-16 LAB — TSH: TSH: 1.38 u[IU]/mL (ref 0.35–5.50)

## 2023-03-16 LAB — CBC WITH DIFFERENTIAL/PLATELET
Basophils Absolute: 0.1 10*3/uL (ref 0.0–0.1)
Basophils Relative: 1.3 % (ref 0.0–3.0)
Eosinophils Absolute: 0.1 10*3/uL (ref 0.0–0.7)
Eosinophils Relative: 2.5 % (ref 0.0–5.0)
HCT: 40 % (ref 36.0–46.0)
Hemoglobin: 13.3 g/dL (ref 12.0–15.0)
Lymphocytes Relative: 22.9 % (ref 12.0–46.0)
Lymphs Abs: 1.2 10*3/uL (ref 0.7–4.0)
MCHC: 33.1 g/dL (ref 30.0–36.0)
MCV: 89.2 fl (ref 78.0–100.0)
Monocytes Absolute: 0.5 10*3/uL (ref 0.1–1.0)
Monocytes Relative: 8.8 % (ref 3.0–12.0)
Neutro Abs: 3.5 10*3/uL (ref 1.4–7.7)
Neutrophils Relative %: 64.5 % (ref 43.0–77.0)
Platelets: 286 10*3/uL (ref 150.0–400.0)
RBC: 4.49 Mil/uL (ref 3.87–5.11)
RDW: 13.1 % (ref 11.5–15.5)
WBC: 5.4 10*3/uL (ref 4.0–10.5)

## 2023-03-16 LAB — LIPID PANEL
Cholesterol: 164 mg/dL (ref 0–200)
HDL: 79.9 mg/dL (ref >39.00)
LDL Cholesterol: 72 mg/dL (ref 0–99)
NonHDL: 84.47
Total CHOL/HDL Ratio: 2
Triglycerides: 60 mg/dL (ref 0.0–149.0)
VLDL: 12 mg/dL (ref 0.0–40.0)

## 2023-03-16 LAB — HEMOGLOBIN A1C: Hgb A1c MFr Bld: 5.4 % (ref 4.6–6.5)

## 2023-03-16 LAB — T4, FREE: Free T4: 0.84 ng/dL (ref 0.60–1.60)

## 2023-03-16 NOTE — Addendum Note (Signed)
Addended by: Deeann Saint on: 03/16/2023 08:42 AM   Modules accepted: Orders

## 2023-03-16 NOTE — Progress Notes (Addendum)
Established Patient Office Visit   Subjective  Patient ID: [name], female    DOB: Jul 28, 1981  Age: 42 y.o. MRN: [id number]  Chief Complaint  Patient presents with   Annual Exam    Patient is a 42 year old female seen for CPE.  Patient states she is doing well overall.  Seen neurology for chronic migraines.  Having 15 migraines per month.  Trying Topamax and Ajovy which does not seem to help.  Currently taking Ubrelvy.  Looking at starting injectable medications in August.  Had some issues with insurance approval.  Patient inquires about getting an updated COVID-vaccine in the fall.    Past Medical History:  Diagnosis Date   Anal fissure    Anxiety    on zyprexa in the past - stopped medication in 2015   Blood in stool    Chicken pox    Gestational diabetes    gestational diabetes   Hypoglycemia    has seen endocrinologist   Migraines    PVC (premature ventricular contraction)    Urinary tract infection    Past Surgical History:  Procedure Laterality Date   CESAREAN SECTION     x 2, 0865,7846   WISDOM TOOTH EXTRACTION     Social History   Tobacco Use   Smoking status: Never   Smokeless tobacco: Never  Vaping Use   Vaping status: Never Used  Substance Use Topics   Alcohol use: Yes    Alcohol/week: 10.0 standard drinks of alcohol   Drug use: No   Family History  Problem Relation Age of Onset   Healthy Mother    Healthy Father    Infertility Sister    Healthy Brother    Breast cancer Paternal Aunt    Ovarian cancer Paternal Aunt    Breast cancer Maternal Grandmother 82       (died in her late 80's after mets)   Esophageal cancer Maternal Grandmother    Stomach cancer Maternal Grandmother    Diabetes Paternal Grandmother    ALS Paternal Grandmother    Heart disease Paternal Grandfather    Prostate cancer Paternal Grandfather    Colon cancer Other        maternal great aunt   Migraines Neg Hx    Headache Neg Hx    No Known Allergies     ROS Negative unless stated above    Objective:     BP 138/86 (BP Location: Left Arm, Patient Position: Sitting, Cuff Size: Normal)   Pulse 70   Temp 98.9 F (37.2 C) (Oral)   Ht 5\' 2"  (1.575 m)   Wt 138 lb 9.6 oz (62.9 kg)   LMP 03/13/2023 (Approximate)   SpO2 99%   BMI 25.35 kg/m  BP Readings from Last 3 Encounters:  03/16/23 138/86  12/24/22 (!) 150/81  10/24/22 118/80   Wt Readings from Last 3 Encounters:  03/16/23 138 lb 9.6 oz (62.9 kg)  12/24/22 136 lb (61.7 kg)  10/24/22 138 lb (62.6 kg)      Physical Exam Constitutional:      Appearance: Normal appearance.  HENT:     Head: Normocephalic and atraumatic.     Right Ear: Tympanic membrane, ear canal and external ear normal.     Left Ear: Tympanic membrane, ear canal and external ear normal.     Nose: Nose normal.     Mouth/Throat:     Mouth: Mucous membranes are moist.     Pharynx: No oropharyngeal exudate or posterior oropharyngeal  erythema.  Eyes:     General: No scleral icterus.    Extraocular Movements: Extraocular movements intact.     Conjunctiva/sclera: Conjunctivae normal.     Pupils: Pupils are equal, round, and reactive to light.  Neck:     Thyroid: No thyromegaly.  Cardiovascular:     Rate and Rhythm: Normal rate and regular rhythm.     Pulses: Normal pulses.     Heart sounds: Normal heart sounds. No murmur heard.    No friction rub.  Pulmonary:     Effort: Pulmonary effort is normal.     Breath sounds: Normal breath sounds. No wheezing, rhonchi or rales.  Abdominal:     General: Bowel sounds are normal.     Palpations: Abdomen is soft.     Tenderness: There is no abdominal tenderness.  Musculoskeletal:        General: No deformity. Normal range of motion.  Lymphadenopathy:     Cervical: No cervical adenopathy.  Skin:    General: Skin is warm and dry.     Findings: No lesion.  Neurological:     General: No focal deficit present.     Mental Status: She is alert and oriented to  person, place, and time.  Psychiatric:        Mood and Affect: Mood normal.        Thought Content: Thought content normal.      No results found for any visits on 03/16/23.    Assessment & Plan:  Well adult exam -     CBC with Differential/Platelet -     TSH -     T4, free -     Hemoglobin A1c -     Lipid panel -     Comprehensive metabolic panel  Chronic migraine without aura without status migrainosus, not intractable -     CBC with Differential/Platelet -     TSH -     T4, free -     Comprehensive metabolic panel  Age-appropriate health screenings discussed.  Mammogram up-to-date done 01/12/2023.  Colonoscopy not yet due.  Pap done 01/23/2021.  Immunizations reviewed.  Labs ordered.  Continue current medications.  Follow-up with neurology for migraines.  Return in about 1 year (around 03/15/2024) for physical.   Deeann Saint, MD

## 2023-03-19 ENCOUNTER — Other Ambulatory Visit (HOSPITAL_BASED_OUTPATIENT_CLINIC_OR_DEPARTMENT_OTHER): Payer: Self-pay

## 2023-03-26 ENCOUNTER — Other Ambulatory Visit (HOSPITAL_BASED_OUTPATIENT_CLINIC_OR_DEPARTMENT_OTHER): Payer: Self-pay

## 2023-03-26 ENCOUNTER — Other Ambulatory Visit (HOSPITAL_COMMUNITY): Payer: Self-pay

## 2023-03-27 ENCOUNTER — Other Ambulatory Visit (HOSPITAL_BASED_OUTPATIENT_CLINIC_OR_DEPARTMENT_OTHER): Payer: Self-pay

## 2023-03-29 ENCOUNTER — Other Ambulatory Visit (HOSPITAL_BASED_OUTPATIENT_CLINIC_OR_DEPARTMENT_OTHER): Payer: Self-pay

## 2023-04-01 ENCOUNTER — Other Ambulatory Visit (HOSPITAL_BASED_OUTPATIENT_CLINIC_OR_DEPARTMENT_OTHER): Payer: Self-pay

## 2023-04-03 ENCOUNTER — Telehealth: Payer: Self-pay

## 2023-04-03 ENCOUNTER — Other Ambulatory Visit (HOSPITAL_COMMUNITY): Payer: Self-pay

## 2023-04-03 NOTE — Telephone Encounter (Signed)
Pharmacy Patient Advocate Encounter   Received notification from CoverMyMeds that prior authorization for Emgality 120MG /ML auto-injectors (migraine) is required/requested.   Insurance verification completed.   The patient is insured through [facility] .   Per test claim: PA required; PA submitted to [facility] via CoverMyMeds Key/confirmation #/EOC  [id number] Status is pending

## 2023-04-07 NOTE — Telephone Encounter (Signed)
Pharmacy Patient Advocate Encounter  Received notification from [facility] that Prior Authorization for Emgality 120MG /ML auto-injectors (migraine) has been APPROVED from 04-07-2023 to 05-07-2023   PA #/Case ID/Reference #: [id number]

## 2023-04-08 ENCOUNTER — Other Ambulatory Visit (HOSPITAL_BASED_OUTPATIENT_CLINIC_OR_DEPARTMENT_OTHER): Payer: Self-pay

## 2023-04-09 ENCOUNTER — Other Ambulatory Visit: Payer: Self-pay

## 2023-04-09 ENCOUNTER — Other Ambulatory Visit (HOSPITAL_BASED_OUTPATIENT_CLINIC_OR_DEPARTMENT_OTHER): Payer: Self-pay

## 2023-04-10 ENCOUNTER — Other Ambulatory Visit (HOSPITAL_BASED_OUTPATIENT_CLINIC_OR_DEPARTMENT_OTHER): Payer: Self-pay

## 2023-04-10 ENCOUNTER — Encounter (HOSPITAL_BASED_OUTPATIENT_CLINIC_OR_DEPARTMENT_OTHER): Payer: Self-pay

## 2023-04-13 ENCOUNTER — Other Ambulatory Visit (HOSPITAL_BASED_OUTPATIENT_CLINIC_OR_DEPARTMENT_OTHER): Payer: Self-pay

## 2023-04-14 ENCOUNTER — Other Ambulatory Visit (HOSPITAL_BASED_OUTPATIENT_CLINIC_OR_DEPARTMENT_OTHER): Payer: Self-pay

## 2023-04-14 ENCOUNTER — Telehealth: Payer: 59 | Admitting: Neurology

## 2023-04-21 ENCOUNTER — Other Ambulatory Visit (HOSPITAL_BASED_OUTPATIENT_CLINIC_OR_DEPARTMENT_OTHER): Payer: Self-pay

## 2023-04-24 ENCOUNTER — Other Ambulatory Visit (HOSPITAL_BASED_OUTPATIENT_CLINIC_OR_DEPARTMENT_OTHER): Payer: Self-pay

## 2023-04-29 DIAGNOSIS — Z0184 Encounter for antibody response examination: Secondary | ICD-10-CM | POA: Diagnosis not present

## 2023-04-30 ENCOUNTER — Other Ambulatory Visit (HOSPITAL_COMMUNITY): Payer: Self-pay

## 2023-05-05 ENCOUNTER — Other Ambulatory Visit (HOSPITAL_BASED_OUTPATIENT_CLINIC_OR_DEPARTMENT_OTHER): Payer: Self-pay

## 2023-05-06 ENCOUNTER — Other Ambulatory Visit (HOSPITAL_BASED_OUTPATIENT_CLINIC_OR_DEPARTMENT_OTHER): Payer: Self-pay

## 2023-05-13 ENCOUNTER — Other Ambulatory Visit (HOSPITAL_COMMUNITY): Payer: Self-pay

## 2023-05-28 ENCOUNTER — Other Ambulatory Visit (HOSPITAL_BASED_OUTPATIENT_CLINIC_OR_DEPARTMENT_OTHER): Payer: Self-pay

## 2023-06-03 ENCOUNTER — Encounter: Payer: Self-pay | Admitting: Neurology

## 2023-06-09 ENCOUNTER — Other Ambulatory Visit (HOSPITAL_BASED_OUTPATIENT_CLINIC_OR_DEPARTMENT_OTHER): Payer: Self-pay

## 2023-06-12 ENCOUNTER — Other Ambulatory Visit (HOSPITAL_BASED_OUTPATIENT_CLINIC_OR_DEPARTMENT_OTHER): Payer: Self-pay

## 2023-06-12 MED ORDER — INFLUENZA VIRUS VACC SPLIT PF (FLUZONE) 0.5 ML IM SUSY
0.5000 mL | PREFILLED_SYRINGE | Freq: Once | INTRAMUSCULAR | 0 refills | Status: AC
  Filled 2023-06-12: qty 0.5, 1d supply, fill #0

## 2023-06-15 ENCOUNTER — Telehealth: Payer: 59 | Admitting: Neurology

## 2023-07-14 ENCOUNTER — Encounter: Payer: Self-pay | Admitting: Neurology

## 2023-07-15 ENCOUNTER — Other Ambulatory Visit: Payer: Self-pay | Admitting: Neurology

## 2023-07-15 ENCOUNTER — Other Ambulatory Visit (HOSPITAL_BASED_OUTPATIENT_CLINIC_OR_DEPARTMENT_OTHER): Payer: Self-pay

## 2023-07-16 ENCOUNTER — Other Ambulatory Visit (HOSPITAL_BASED_OUTPATIENT_CLINIC_OR_DEPARTMENT_OTHER): Payer: Self-pay

## 2023-07-16 MED ORDER — EMGALITY 120 MG/ML ~~LOC~~ SOAJ
120.0000 mg | SUBCUTANEOUS | 3 refills | Status: DC
  Filled 2023-07-16 – 2023-09-17 (×2): qty 1, 30d supply, fill #0

## 2023-07-16 MED ORDER — EMGALITY 120 MG/ML ~~LOC~~ SOAJ: 120.0000 mL | SUBCUTANEOUS | Status: DC

## 2023-07-16 NOTE — Telephone Encounter (Signed)
Sample ready for pick up

## 2023-07-17 ENCOUNTER — Other Ambulatory Visit (HOSPITAL_BASED_OUTPATIENT_CLINIC_OR_DEPARTMENT_OTHER): Payer: Self-pay

## 2023-07-17 MED ORDER — SULFAMETHOXAZOLE-TRIMETHOPRIM 800-160 MG PO TABS
1.0000 | ORAL_TABLET | Freq: Two times a day (BID) | ORAL | 0 refills | Status: DC
  Filled 2023-07-17: qty 14, 7d supply, fill #0

## 2023-08-27 ENCOUNTER — Other Ambulatory Visit (HOSPITAL_BASED_OUTPATIENT_CLINIC_OR_DEPARTMENT_OTHER): Payer: Self-pay

## 2023-09-08 ENCOUNTER — Other Ambulatory Visit (HOSPITAL_BASED_OUTPATIENT_CLINIC_OR_DEPARTMENT_OTHER): Payer: Self-pay

## 2023-09-10 ENCOUNTER — Other Ambulatory Visit (HOSPITAL_COMMUNITY): Payer: Self-pay

## 2023-09-14 ENCOUNTER — Other Ambulatory Visit (HOSPITAL_BASED_OUTPATIENT_CLINIC_OR_DEPARTMENT_OTHER): Payer: Self-pay

## 2023-09-15 ENCOUNTER — Telehealth: Payer: Self-pay | Admitting: Neurology

## 2023-09-15 ENCOUNTER — Telehealth (INDEPENDENT_AMBULATORY_CARE_PROVIDER_SITE_OTHER): Payer: Self-pay | Admitting: Neurology

## 2023-09-15 DIAGNOSIS — G43009 Migraine without aura, not intractable, without status migrainosus: Secondary | ICD-10-CM

## 2023-09-15 DIAGNOSIS — G43709 Chronic migraine without aura, not intractable, without status migrainosus: Secondary | ICD-10-CM

## 2023-09-15 NOTE — Progress Notes (Signed)
Virtual Visit via Video Note  I connected with [name] on 09/20/23 at 11:30 AM EST by a video enabled telemedicine application and verified that I am speaking with the correct person using two identifiers.  Location: Patient: home Provider: office   I discussed the limitations of evaluation and management by telemedicine and the availability of in person appointments. The patient expressed understanding and agreed to proceed.  Follow Up Instructions:    I discussed the assessment and treatment plan with the patient. The patient was provided an opportunity to ask questions and all were answered. The patient agreed with the plan and demonstrated an understanding of the instructions.   The patient was advised to call back or seek an in-person evaluation if the symptoms worsen or if the condition fails to improve as anticipated.  I provided 20 minutes of non-face-to-face time during this encounter.   Anson Fret, MD  CC: migraines  09/15/2023: At baseline had 15-20 monthly headache days 8 of those are moderate to severe migraine days. On Emgality she has significant improvement, 5 migraine days a month and < 10 total headache days a month. Continue emgality prevention and Ubrelvy   Tried: nortriptyline, propranolol, topamax, rizatriptan, imitrex, aimovig contraindicated due to constipation, Topamax (side effect severe burning and tingling even at low dose cannot tolerate), ajovy, emgality, aimovig contrindicated due to constipation   HISTORY OF PRESENT ILLNESS:  09/20/2023: At baseline had   12/24/2022: She has migraines for the last year 24 migraine days a month, they are in the middle of her head, when moderate to severe throbbing/pulsating, nausea, light sensitivity. Last 24 hours.   Tried: nortriptyline, propranolol, topamax, rizatriptan, imitrex, aimovig contraindicated due to constipation, Topamax (side effect severe burning and tingling even at low dose), Ajovy,    12/04/2021 ALL:  Melayna returns for follow up for headaches. We weaned propranolol and titrated nortriptyline to 50mg  QHS. We were hopeful nortriptyline would help with anxiety and sleep. She has tolerated medication but does not find it to be very effective. She is sleeping well. No change in mood. She continues to have headaches before, during and a few days following menses. Cycles are very regular. She is taking naproxen 440mg  daily five days prior but not sure it is helping much. Headaches are more tension style. No obvious migrainous symptoms.    ALL:  [name] is a 43 y.o. female here today for follow up for migraines. We increased propranolol to 60mg  daily and continued rizatriptan as needed. She is not sure she has noted any improvement since last visit 03/2020. She does report irregular heart rate seems better but migraines seem to continue. She has near daily headaches the week prior to and week of her menstrual cycle. Headaches are usually bifrontal throbbing. Not usually associated with light or sound sensitivity. Rare nausea with really bad headache. She has difficulty staying asleep. She feels that her mind wont shut down at times. She is not on birth control. Husband had vasectomy.   04/17/20 ALL:  [name] is a 43 y.o. female here today for follow up for migraines. She continues propranolol 20mg  BID and rizatriptan as needed. She feels that propranolol has helped with headache intensity. She feels that head frequency continues to be a concern. She has a headache at least 4-5 times a week but feels they are usually fairly mild. She has not needed to take rizatriptan. She will use naproxen or Tylenol as needed, especially around menses.  She estimates taking OTC medications at least 1/2 of the month.   REVIEW OF SYSTEMS: Out of a complete 14 system review of symptoms, the patient complains only of the following symptoms, headaches and all other reviewed systems are  negative.  ALLERGIES: No Known Allergies   HOME MEDICATIONS: Outpatient Medications Prior to Visit  Medication Sig Dispense Refill   Cetirizine HCl (ZYRTEC PO) Take by mouth.     ibuprofen (ADVIL) 800 MG tablet Take 1 tablet (800 mg total) by mouth 3 (three) times daily. 30 tablet 3   nitrofurantoin, macrocrystal-monohydrate, (MACROBID) 100 MG capsule Take 1 capsule by mouth once daily immediately following intercourse, no more than once daily 30 capsule 3   Probiotic Product (PROBIOTIC PO) Take by mouth.     sulfamethoxazole-trimethoprim (BACTRIM DS) 800-160 MG tablet Take 1 tablet by mouth 2 (two) times daily. 14 tablet 0   Galcanezumab-gnlm (EMGALITY) 120 MG/ML SOAJ Inject 120 mg into the skin every 30 (thirty) days. 1 mL 3   Galcanezumab-gnlm (EMGALITY) 120 MG/ML SOAJ Inject 120 mLs into the skin every 30 (thirty) days. Lot  [id number]  Exp 03-02-2025 1 box of 2     Ubrogepant (UBRELVY) 100 MG TABS Take 1 tablet (100 mg total) by mouth every 2 (two) hours as needed. Maximum 2 tablets (200mg ) a day. 16 tablet 11   No facility-administered medications prior to visit.     PAST MEDICAL HISTORY: Past Medical History:  Diagnosis Date   Anal fissure    Anxiety    on zyprexa in the past - stopped medication in 2015   Blood in stool    Chicken pox    Gestational diabetes    gestational diabetes   Hypoglycemia    has seen endocrinologist   Migraines    PVC (premature ventricular contraction)    Urinary tract infection      PAST SURGICAL HISTORY: Past Surgical History:  Procedure Laterality Date   CESAREAN SECTION     x 2, 2009,2012   WISDOM TOOTH EXTRACTION       FAMILY HISTORY: Family History  Problem Relation Age of Onset   Healthy Mother    Healthy Father    Infertility Sister    Healthy Brother    Breast cancer Paternal Aunt    Ovarian cancer Paternal Aunt    Breast cancer Maternal Grandmother 74       (died in her late 37's after mets)   Esophageal cancer  Maternal Grandmother    Stomach cancer Maternal Grandmother    Diabetes Paternal Grandmother    ALS Paternal Grandmother    Heart disease Paternal Grandfather    Prostate cancer Paternal Grandfather    Colon cancer Other        maternal great aunt   Migraines Neg Hx    Headache Neg Hx      SOCIAL HISTORY: Social History   Socioeconomic History   Marital status: Married    Spouse name: Not on file   Number of children: 2   Years of education: 20   Highest education level: Doctorate  Occupational History   Occupation: audiologist  Tobacco Use   Smoking status: Never   Smokeless tobacco: Never  Vaping Use   Vaping status: Never Used  Substance and Sexual Activity   Alcohol use: Yes    Alcohol/week: 10.0 standard drinks of alcohol   Drug use: No   Sexual activity: Not on file  Other Topics Concern   Not on file  Social  History Narrative   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   Home Situation: Lives with two children and husband      Spiritual Beliefs: Catholic      Lifestyle: no regular exercise; diet is good      Caffeine: 1 cup of coffee & 1 green tea daily   Social Drivers of Corporate investment banker Strain: Low Risk  (05/28/2022)   Overall Financial Resource Strain (CARDIA)    Difficulty of Paying Living Expenses: Not hard at all  Food Insecurity: No Food Insecurity (05/28/2022)   Hunger Vital Sign    Worried About Running Out of Food in the Last Year: Never true    Ran Out of Food in the Last Year: Never true  Transportation Needs: No Transportation Needs (05/28/2022)   PRAPARE - Administrator, Civil Service (Medical): No    Lack of Transportation (Non-Medical): No  Physical Activity: Insufficiently Active (05/28/2022)   Exercise Vital Sign    Days of Exercise per Week: 3 days    Minutes of Exercise per Session: 40 min  Stress: No Stress Concern Present (05/28/2022)   [employer] of Occupational Health -  Occupational Stress Questionnaire    Feeling of Stress : Only a little  Social Connections: Socially Integrated (05/28/2022)   Social Connection and Isolation Panel [NHANES]    Frequency of Communication with Friends and Family: Three times a week    Frequency of Social Gatherings with Friends and Family: Twice a week    Attends Religious Services: More than 4 times per year    Active Member of [employer] or Organizations: Yes    Attends Engineer, structural: More than 4 times per year    Marital Status: Married  Catering manager Violence: Not on file     PHYSICAL EXAM  There were no vitals filed for this visit.   There is no height or weight on file to calculate BMI.   DIAGNOSTIC DATA (LABS, IMAGING, TESTING) - I reviewed patient records, labs, notes, testing and imaging myself where available.  Lab Results  Component Value Date   WBC 5.4 03/16/2023   HGB 13.3 03/16/2023   HCT 40.0 03/16/2023   MCV 89.2 03/16/2023   PLT 286.0 03/16/2023      Component Value Date/Time   NA 138 03/16/2023 0839   NA 138 11/02/2019 0846   K 3.9 03/16/2023 0839   CL 104 03/16/2023 0839   CO2 22 03/16/2023 0839   GLUCOSE 100 (H) 03/16/2023 0839   BUN 13 03/16/2023 0839   BUN 8 11/02/2019 0846   CREATININE 0.64 03/16/2023 0839   CALCIUM 9.3 03/16/2023 0839   PROT 7.3 03/16/2023 0839   ALBUMIN 4.5 03/16/2023 0839   AST 14 03/16/2023 0839   ALT 9 03/16/2023 0839   ALKPHOS 61 03/16/2023 0839   BILITOT 1.0 03/16/2023 0839   GFRNONAA 110 11/02/2019 0846   GFRAA 127 11/02/2019 0846   Lab Results  Component Value Date   CHOL 164 03/16/2023   HDL 79.90 03/16/2023   LDLCALC 72 03/16/2023   TRIG 60.0 03/16/2023   CHOLHDL 2 03/16/2023   Lab Results  Component Value Date   HGBA1C 5.4 03/16/2023   No results found for: "VITAMINB12" Lab Results  Component Value Date   TSH 1.38 03/16/2023        No data to display  No data to display          Physical  exam: Exam: Gen: NAD, conversant      CV: No palpitations or chest pain or SOB. VS: Breathing at a normal rate. Weight appears within normal limits. Not febrile. Eyes: Conjunctivae clear without exudates or hemorrhage  Neuro: Detailed Neurologic Exam  Speech:    Speech is normal; fluent and spontaneous with normal comprehension.  Cognition:    The patient is oriented to person, place, and time;     recent and remote memory intact;     language fluent;     normal attention, concentration, fund of knowledge Cranial Nerves:    The pupils are equal, round, and reactive to light. Visual fields are full Extraocular movements are intact.  The face is symmetric with normal sensation. The palate elevates in the midline. Hearing intact. Voice is normal. Shoulder shrug is normal. The tongue has normal motion without fasciculations.   Coordination: normal  Gait:    No abnormalities noted or reported  Motor Observation:   no involuntary movements noted. Tone:    Appears normal  Posture:    Posture is normal. normal erect    Strength:    Strength is anti-gravity and symmetric in the upper and lower limbs.      Sensation: intact to LT, no reports of numbness or tingling or paresthesias        ASSESSMENT AND PLAN  43 y.o. year old female  has a past medical history of Anal fissure, Anxiety, Blood in stool, Chicken pox, Gestational diabetes, Hypoglycemia, Migraines, PVC (premature ventricular contraction), and Urinary tract infection. here with    Chronic migraine without aura without status migrainosus, not intractable - Plan: Galcanezumab-gnlm (EMGALITY) 120 MG/ML SOAJ  Migraine without aura and without status migrainosus, not intractable - Plan: Ubrogepant (UBRELVY) 100 MG TABS  At baseline had 15-20 monthly headache days 8 of those are moderate to severe migraine days. On Emgality she has significant improvement, 5 migraine days a month and < 10 total headache days a month.  Continue emgality prevention and Ubrelvy   Tried: nortriptyline, propranolol, topamax, rizatriptan, imitrex, aimovig contraindicated due to constipation, Topamax (side effect severe burning and tingling even at low dose cannot tolerate), ajovy, emgality, aimovig contrindicated due to constipation  Meds ordered this encounter  Medications   Galcanezumab-gnlm (EMGALITY) 120 MG/ML SOAJ    Sig: Inject 120 mg into the skin every 30 (thirty) days.    Dispense:  1.12 mL    Refill:  11   Ubrogepant (UBRELVY) 100 MG TABS    Sig: Take 1 tablet (100 mg total) by mouth every 2 (two) hours as needed. Maximum 2 tablets (200mg ) a day.    Dispense:  16 tablet    Refill:  11       Naomie Dean MD 09/20/2023, 1:36 PM  [facility] [address], [address] [place], [place] [address] [phone]  I spent over 40 minutes of face-to-face and non-face-to-face time with patient on the  1. Chronic migraine without aura without status migrainosus, not intractable   2. Migraine without aura and without status migrainosus, not intractable     diagnosis.  This included previsit chart review, lab review, study review, order entry, electronic health record documentation, patient education on the different diagnostic and therapeutic options, counseling and coordination of care, risks and benefits of management, compliance, or risk factor reduction

## 2023-09-15 NOTE — Telephone Encounter (Signed)
Please call patient and get her new insurance information. Please let me know when it is in her chart so I can refill emgality and ubrelvy and also start the botox for migraine protocol (need to finish my note) thanks no follow up as we will see her for botox.

## 2023-09-15 NOTE — Telephone Encounter (Signed)
Pt's insurance has been updated in chart

## 2023-09-16 ENCOUNTER — Other Ambulatory Visit (HOSPITAL_BASED_OUTPATIENT_CLINIC_OR_DEPARTMENT_OTHER): Payer: Self-pay

## 2023-09-17 ENCOUNTER — Other Ambulatory Visit (HOSPITAL_BASED_OUTPATIENT_CLINIC_OR_DEPARTMENT_OTHER): Payer: Self-pay

## 2023-09-18 ENCOUNTER — Other Ambulatory Visit: Payer: Self-pay

## 2023-09-19 ENCOUNTER — Other Ambulatory Visit (HOSPITAL_BASED_OUTPATIENT_CLINIC_OR_DEPARTMENT_OTHER): Payer: Self-pay

## 2023-09-20 ENCOUNTER — Other Ambulatory Visit (HOSPITAL_BASED_OUTPATIENT_CLINIC_OR_DEPARTMENT_OTHER): Payer: Self-pay

## 2023-09-20 ENCOUNTER — Encounter: Payer: Self-pay | Admitting: Neurology

## 2023-09-20 DIAGNOSIS — G43009 Migraine without aura, not intractable, without status migrainosus: Secondary | ICD-10-CM | POA: Insufficient documentation

## 2023-09-20 MED ORDER — EMGALITY 120 MG/ML ~~LOC~~ SOAJ
120.0000 mg | SUBCUTANEOUS | 11 refills | Status: AC
  Filled 2023-09-20 – 2023-09-21 (×2): qty 1, 30d supply, fill #0
  Filled 2023-10-07 – 2023-10-20 (×4): qty 1, 30d supply, fill #1
  Filled 2023-11-17: qty 1, 30d supply, fill #2
  Filled 2023-12-14: qty 1, 30d supply, fill #3
  Filled 2024-01-07: qty 1, 30d supply, fill #4
  Filled 2024-01-28 – 2024-02-03 (×2): qty 1, 30d supply, fill #5

## 2023-09-20 MED ORDER — UBRELVY 100 MG PO TABS
100.0000 mg | ORAL_TABLET | ORAL | 11 refills | Status: AC | PRN
  Filled 2023-09-20: qty 16, 30d supply, fill #0
  Filled 2023-11-09 – 2023-11-17 (×2): qty 16, 30d supply, fill #1
  Filled 2024-01-07: qty 16, 30d supply, fill #2
  Filled 2024-01-28 – 2024-02-03 (×2): qty 16, 30d supply, fill #3

## 2023-09-21 ENCOUNTER — Telehealth: Payer: Self-pay | Admitting: *Deleted

## 2023-09-21 ENCOUNTER — Telehealth: Payer: Self-pay

## 2023-09-21 ENCOUNTER — Other Ambulatory Visit (HOSPITAL_COMMUNITY): Payer: Self-pay

## 2023-09-21 ENCOUNTER — Other Ambulatory Visit (HOSPITAL_BASED_OUTPATIENT_CLINIC_OR_DEPARTMENT_OTHER): Payer: Self-pay

## 2023-09-21 NOTE — Telephone Encounter (Signed)
Pt states she is waiting on [place] and [employer] PAs.   I was able to complete [place] PA on CMM. Key: [id number]. Awaiting determination from [employer].

## 2023-09-21 NOTE — Telephone Encounter (Signed)
Pharmacy Patient Advocate Encounter  Received notification from [facility] that Prior Authorization for Emgality 120MG /ML auto-injectors (migraine) has been APPROVED from 09/21/2023 to 09/20/2024. Ran test claim, Copay is $35.00. This test claim was processed through [facility]- copay amounts may vary at other pharmacies due to pharmacy/plan contracts, or as the patient moves through the different stages of their insurance plan.   PA #/Case ID/Reference #: PA Case: [id number]

## 2023-09-21 NOTE — Telephone Encounter (Signed)
Thanks I let the pt know.

## 2023-09-21 NOTE — Telephone Encounter (Signed)
Jan 29 or feb 6 at 330 as an add on?

## 2023-09-21 NOTE — Telephone Encounter (Signed)
Ok we have to get auth first so 1/29 is too soon.

## 2023-09-21 NOTE — Telephone Encounter (Signed)
Pharmacy Patient Advocate Encounter   Received notification from Physician's Office that prior authorization for Emgality 120MG /ML auto-injectors (migraine) is required/requested.   Insurance verification completed.   The patient is insured through [employer] .   Per test claim: PA required; PA submitted to above mentioned insurance via CoverMyMeds Key/confirmation #/EOC [id number] Status is pending

## 2023-09-21 NOTE — Telephone Encounter (Signed)
Feb 6 or jan 29 at 330 possibly if she can make it? You can create a botox slot then for her thanks

## 2023-09-21 NOTE — Telephone Encounter (Signed)
Chronic Migraine CPT 64615  Botox J0585 Units:200  G43.709 Chronic Migraine without aura, not intractable, without status migrainous

## 2023-09-21 NOTE — Telephone Encounter (Signed)
PA request has been Submitted. New Encounter created for follow up. For additional info see Pharmacy Prior Auth telephone encounter from 09/21/2023.

## 2023-09-22 ENCOUNTER — Other Ambulatory Visit (HOSPITAL_COMMUNITY): Payer: Self-pay

## 2023-09-22 NOTE — Telephone Encounter (Signed)
Pt has been updated.

## 2023-09-22 NOTE — Telephone Encounter (Addendum)
I was able to complete Botox PA (medical and pharmacy combo PA) on CMM. Key: [id number]. Awaiting determination from [facility] within 24 hours to 5 business days. I put [facility] as the SP.

## 2023-09-22 NOTE — Telephone Encounter (Signed)
Would you be able to work on this auth as well since [name] is out? Thank you!

## 2023-09-27 ENCOUNTER — Other Ambulatory Visit (HOSPITAL_BASED_OUTPATIENT_CLINIC_OR_DEPARTMENT_OTHER): Payer: Self-pay

## 2023-09-28 MED ORDER — ONABOTULINUMTOXINA 200 UNITS IJ SOLR: INTRAMUSCULAR | 3 refills | Status: DC

## 2023-09-28 NOTE — Telephone Encounter (Signed)
Received approval from [employer], please send rx to [facility]. They take about a week to process new orders, so we will likely need to schedule her past 2/6.  Auth#: [id number] (09/23/23-03/21/24)

## 2023-09-28 NOTE — Addendum Note (Signed)
Addended by: Bertram Savin on: 09/28/2023 08:48 AM   Modules accepted: Orders

## 2023-09-28 NOTE — Telephone Encounter (Signed)
Botox 200 unit Rx sent to [facility]

## 2023-09-29 NOTE — Telephone Encounter (Signed)
Received approval, I submitted copies of auth and ins card to [facility]. Rep states this will take 5-8 days to process.   Auth#: [id number] (09/23/23-03/21/24)

## 2023-09-30 ENCOUNTER — Telehealth: Payer: Self-pay | Admitting: *Deleted

## 2023-09-30 ENCOUNTER — Other Ambulatory Visit (HOSPITAL_BASED_OUTPATIENT_CLINIC_OR_DEPARTMENT_OTHER): Payer: Self-pay

## 2023-09-30 ENCOUNTER — Other Ambulatory Visit (HOSPITAL_COMMUNITY): Payer: Self-pay

## 2023-09-30 ENCOUNTER — Telehealth: Payer: Self-pay

## 2023-09-30 ENCOUNTER — Encounter: Payer: Self-pay | Admitting: Neurology

## 2023-09-30 NOTE — Telephone Encounter (Signed)
 Can we check for an update on this ASAP? Thank you so much!

## 2023-09-30 NOTE — Telephone Encounter (Addendum)
 I was asked to follow up on PA by [employer]-Bethany had submitted the PA on CMM-I called insurance which is actually [employer] [phone] is still under review-no additional info needed at this time-states it is showing a 5 bus day turn around. Awaiting determination.Total call time .  Reference: [id number]

## 2023-09-30 NOTE — Telephone Encounter (Signed)
 Would not let me start a new request as yours is still pending-[name] has called the plan and will make a new encounter to update on the situation.

## 2023-09-30 NOTE — Telephone Encounter (Signed)
 PA request has been  Followed-up . New Encounter created for follow up. For additional info see Pharmacy Prior Auth telephone encounter from 09/30/2023.  Started a new encounter to get credit for the work.

## 2023-09-30 NOTE — Telephone Encounter (Signed)
 Insurance has still not responded, I can try to resubmit the request and see what happens.   PA request has been Submitted. New Encounter created for follow up. For additional info see Pharmacy Prior Auth telephone encounter from 02/05.

## 2023-09-30 NOTE — Telephone Encounter (Signed)
 Thank you :)

## 2023-10-02 ENCOUNTER — Other Ambulatory Visit (HOSPITAL_BASED_OUTPATIENT_CLINIC_OR_DEPARTMENT_OTHER): Payer: Self-pay

## 2023-10-04 ENCOUNTER — Other Ambulatory Visit: Payer: Self-pay

## 2023-10-07 ENCOUNTER — Other Ambulatory Visit (HOSPITAL_BASED_OUTPATIENT_CLINIC_OR_DEPARTMENT_OTHER): Payer: Self-pay

## 2023-10-07 MED ORDER — SULFAMETHOXAZOLE-TRIMETHOPRIM 800-160 MG PO TABS
1.0000 | ORAL_TABLET | Freq: Two times a day (BID) | ORAL | 0 refills | Status: DC
  Filled 2023-10-07: qty 14, 7d supply, fill #0

## 2023-10-08 NOTE — Telephone Encounter (Signed)
Spoke with Shawn @ [facility], he states medical benefits are still being verified and I should check back in a few days.

## 2023-10-10 ENCOUNTER — Other Ambulatory Visit (HOSPITAL_BASED_OUTPATIENT_CLINIC_OR_DEPARTMENT_OTHER): Payer: Self-pay

## 2023-10-12 NOTE — Telephone Encounter (Signed)
NP in march please

## 2023-10-13 NOTE — Telephone Encounter (Signed)
Relayed copay card info to [facility].

## 2023-10-15 ENCOUNTER — Other Ambulatory Visit: Payer: Self-pay

## 2023-10-20 ENCOUNTER — Other Ambulatory Visit (HOSPITAL_BASED_OUTPATIENT_CLINIC_OR_DEPARTMENT_OTHER): Payer: Self-pay

## 2023-11-09 ENCOUNTER — Telehealth: Payer: Self-pay

## 2023-11-09 ENCOUNTER — Other Ambulatory Visit (HOSPITAL_COMMUNITY): Payer: Self-pay

## 2023-11-09 NOTE — Telephone Encounter (Signed)
 I called [employer] back to follow up-they said PA was approved from 10/01/2023-09/30/2024 however test claim would not go thru and they reached out to the pharmacy an was told a new PA needed to be submitted-Awaiting fax form to completed and fax back into [facility]  Reference: [id number]

## 2023-11-09 NOTE — Telephone Encounter (Signed)
 Pharmacy Patient Advocate Encounter   Received notification from  PT Insurance [employer]  that prior authorization for Ubrelvy 100mg  tablets is required/requested.   Insurance verification completed.   The patient is insured through  [employer]  .   Per test claim: PA required; PA submitted to above mentioned insurance via Fax Key/confirmation #/EOC N/A Status is pending

## 2023-11-17 ENCOUNTER — Other Ambulatory Visit (HOSPITAL_BASED_OUTPATIENT_CLINIC_OR_DEPARTMENT_OTHER): Payer: Self-pay

## 2023-11-18 ENCOUNTER — Other Ambulatory Visit (HOSPITAL_COMMUNITY): Payer: Self-pay

## 2023-11-19 ENCOUNTER — Ambulatory Visit: Payer: BLUE CROSS/BLUE SHIELD | Admitting: Adult Health

## 2023-11-30 ENCOUNTER — Other Ambulatory Visit: Payer: Self-pay | Admitting: Family Medicine

## 2023-11-30 DIAGNOSIS — Z1231 Encounter for screening mammogram for malignant neoplasm of breast: Secondary | ICD-10-CM

## 2023-12-14 ENCOUNTER — Telehealth: Payer: Self-pay | Admitting: Neurology

## 2023-12-14 ENCOUNTER — Other Ambulatory Visit (HOSPITAL_BASED_OUTPATIENT_CLINIC_OR_DEPARTMENT_OTHER): Payer: Self-pay

## 2023-12-14 NOTE — Telephone Encounter (Signed)
 Patient call to schedule appointment with Dr. Tresia Fruit to follow up on how Galcanezumab -gnlm (EMGALITY ) 120 MG/ML SOAJ

## 2024-01-07 ENCOUNTER — Other Ambulatory Visit (HOSPITAL_BASED_OUTPATIENT_CLINIC_OR_DEPARTMENT_OTHER): Payer: Self-pay

## 2024-01-08 ENCOUNTER — Telehealth: Payer: Self-pay

## 2024-01-08 ENCOUNTER — Other Ambulatory Visit (HOSPITAL_COMMUNITY): Payer: Self-pay

## 2024-01-08 NOTE — Telephone Encounter (Signed)
 Pharmacy Patient Advocate Encounter   Received notification from CoverMyMeds that prior authorization for Emgality  120MG /ML auto-injectors (migraine) is required/requested.   Insurance verification completed.   The patient is insured through [facility] .   Per test claim: PA required; PA submitted to above mentioned insurance via CoverMyMeds Key/confirmation #/EOC [facility] Status is pending

## 2024-01-08 NOTE — Telephone Encounter (Signed)
 Pharmacy Patient Advocate Encounter   Received notification from CoverMyMeds that prior authorization for Ubrelvy  100MG  tablets is required/requested.   Insurance verification completed.   The patient is insured through [facility] .   Per test claim: PA required; PA submitted to above mentioned insurance via CoverMyMeds Key/confirmation #/EOC [id number] Status is pending

## 2024-01-08 NOTE — Telephone Encounter (Signed)
 Pharmacy Patient Advocate Encounter  Received notification from [facility] that Prior Authorization for Emgality  120MG /ML auto-injectors (migraine) has been APPROVED from 01/08/2024 to 01/07/2025. Ran test claim, Copay is $25.00. This test claim was processed through [facility]- copay amounts may vary at other pharmacies due to pharmacy/plan contracts, or as the patient moves through the different stages of their insurance plan.   PA #/Case ID/Reference #: [id number]

## 2024-01-11 ENCOUNTER — Other Ambulatory Visit (HOSPITAL_BASED_OUTPATIENT_CLINIC_OR_DEPARTMENT_OTHER): Payer: Self-pay

## 2024-01-13 ENCOUNTER — Ambulatory Visit
Admission: RE | Admit: 2024-01-13 | Discharge: 2024-01-13 | Disposition: A | Discharge status: Home / Self Care | Payer: Self-pay | Source: Ambulatory Visit | Attending: Family Medicine | Admitting: Family Medicine

## 2024-01-13 DIAGNOSIS — Z1231 Encounter for screening mammogram for malignant neoplasm of breast: Secondary | ICD-10-CM

## 2024-01-20 ENCOUNTER — Other Ambulatory Visit (HOSPITAL_COMMUNITY): Payer: Self-pay

## 2024-01-20 NOTE — Telephone Encounter (Signed)
 Pharmacy Patient Advocate Encounter  Received notification from [employer] that Prior Authorization for Ubrelvy  100MG  tablets has been APPROVED from 01/08/2024 to 01/07/2025. Unable to obtain price due to refill too soon rejection, last fill date 01/11/2024 next available fill date6/06/2024   PA #/Case ID/Reference #: PA Case ID #: [id number]

## 2024-01-28 ENCOUNTER — Other Ambulatory Visit (HOSPITAL_BASED_OUTPATIENT_CLINIC_OR_DEPARTMENT_OTHER): Payer: Self-pay

## 2024-02-01 ENCOUNTER — Ambulatory Visit (INDEPENDENT_AMBULATORY_CARE_PROVIDER_SITE_OTHER): Payer: 59 | Admitting: Obstetrics and Gynecology

## 2024-02-01 ENCOUNTER — Other Ambulatory Visit (HOSPITAL_COMMUNITY)
Admission: RE | Admit: 2024-02-01 | Discharge: 2024-02-01 | Disposition: A | Discharge status: Home / Self Care | Source: Ambulatory Visit | Attending: Obstetrics and Gynecology | Admitting: Obstetrics and Gynecology

## 2024-02-01 ENCOUNTER — Encounter: Payer: Self-pay | Admitting: Obstetrics and Gynecology

## 2024-02-01 VITALS — BP 130/86 | HR 91 | Ht 61.0 in | Wt 144.0 lb

## 2024-02-01 DIAGNOSIS — R350 Frequency of micturition: Secondary | ICD-10-CM | POA: Diagnosis not present

## 2024-02-01 DIAGNOSIS — R8290 Unspecified abnormal findings in urine: Secondary | ICD-10-CM | POA: Insufficient documentation

## 2024-02-01 DIAGNOSIS — N390 Urinary tract infection, site not specified: Secondary | ICD-10-CM

## 2024-02-01 DIAGNOSIS — Z87440 Personal history of urinary (tract) infections: Secondary | ICD-10-CM

## 2024-02-01 LAB — POCT URINALYSIS DIP (CLINITEK)
Bilirubin, UA: NEGATIVE
Blood, UA: NEGATIVE
Glucose, UA: NEGATIVE mg/dL
Ketones, POC UA: NEGATIVE mg/dL
Leukocytes, UA: NEGATIVE
Nitrite, UA: NEGATIVE
POC PROTEIN,UA: NEGATIVE
Spec Grav, UA: <=1.005 — AB (ref 1.010–1.025)
Urobilinogen, UA: 0.2 U/dL
pH, UA: 6.5 (ref 5.0–8.0)

## 2024-02-01 NOTE — Patient Instructions (Signed)
 If you have symptoms of a UTI like increased urgency and frequency along with burning with urination of suprapubic pain, call our office to come in for a urine sample.

## 2024-02-01 NOTE — Progress Notes (Signed)
 New Patient Evaluation and Consultation  Referring Provider: Viola Greulich, MD PCP: Viola Greulich, MD Date of Service: 02/01/2024  SUBJECTIVE Chief Complaint: New Patient (Initial Visit) [name] is a 43 y.o. female here today for chronic UTIs)  History of Present Illness: [name] is a 43 y.o. White or Caucasian female presenting for evaluation of recurrent UTI.    Review of records significant for:  10/07/23- E.Coli- pansensitive 07/17/23- E.Coli ESBL- resistant to cephalosporins, levofloxacin 10/24/22- E.Coli- pansensitive  Has been on postcoital macrobid   Urinary Symptoms: Does not leak urine.   Day time voids 10+.  Nocturia: 0-1 times per night to void. She reports she does not have urgency- she voids frequently due to water intake.  Voiding dysfunction:  empties bladder well.  Patient does not use a catheter to empty bladder.  When urinating, patient feels she has no difficulties Drinks: 1 cup coffee in AM, 70-90oz water per day, occasional tea or wine  UTIs: 5 UTI's in the last year.  She reports an odor to her urine, usually happens after intercourse. Denies burning with urination or increased urgency or frequency. Currently notices an odor She is taking d-mannose and probiotic daily.  She was doing post coital antibiotics Denies history of blood in urine and kidney or bladder stones   Pelvic Organ Prolapse Symptoms:                  Patient Denies a feeling of a bulge the vaginal area.   Bowel Symptom: Bowel movements: 1 time(s) per day Stool consistency: soft  Straining: no.  Splinting: no.  Incomplete evacuation: no.  Patient Denies accidental bowel leakage / fecal incontinence Bowel regimen: none  Sexual Function Sexually active: yes.  Pain with sex: No  Pelvic Pain Denies pelvic pain   Past Medical History:  Past Medical History:  Diagnosis Date   Anal fissure    Anxiety    on zyprexa  in the past - stopped medication in 2015   Blood  in stool    Chicken pox    Hypoglycemia    has seen endocrinologist   Migraines    PVC (premature ventricular contraction)    Urinary tract infection      Past Surgical History:   Past Surgical History:  Procedure Laterality Date   CESAREAN SECTION     x 2, 2956,2130   WISDOM TOOTH EXTRACTION       Past OB/GYN History: OB History  Gravida Para Term Preterm AB Living  2 2 1   2   SAB IAB Ectopic Multiple Live Births      2    # Outcome Date GA Lbr Len/2nd Weight Sex Type Anes PTL Lv  2 Term     F CS-LTranv   LIV  1 Para     F CS-LTranv   LIV    Patient's last menstrual period was 12/29/2023. Has regular periods.  Contraception: vasectomy Any history of abnormal pap smears: no (pt reports last was June 2024)   Medications: Patient has a current medication list which includes the following prescription(s): d-mannose, cetirizine  hcl, emgality , probiotic product, and ubrelvy .   Allergies: Patient has no known allergies.   Social History:  Social History   Tobacco Use   Smoking status: Never   Smokeless tobacco: Never  Vaping Use   Vaping status: Never Used  Substance Use Topics   Alcohol use: Yes    Alcohol/week: 10.0 standard drinks of alcohol   Drug use:  No    Relationship status: married Patient lives with husband and children.   Patient is employed as an Biomedical scientist. Regular exercise: Yes: strength training and cardio History of abuse: No  Family History:   Family History  Problem Relation Age of Onset   Healthy Mother    Healthy Father    Infertility Sister    Healthy Brother    Breast cancer Maternal Grandmother 67       (died in her late 22's after mets)   Esophageal cancer Maternal Grandmother    Stomach cancer Maternal Grandmother    Diabetes Paternal Grandmother    ALS Paternal Grandmother    Heart disease Paternal Grandfather    Prostate cancer Paternal Grandfather    Breast cancer Paternal Aunt    Ovarian cancer Paternal Aunt    Colon  cancer Other        maternal great aunt   Migraines Neg Hx    Headache Neg Hx    Bladder Cancer Neg Hx    Uterine cancer Neg Hx    Renal cancer Neg Hx      Review of Systems: Review of Systems  Constitutional:  Negative for fever, malaise/fatigue and weight loss.  Respiratory:  Negative for cough, shortness of breath and wheezing.   Cardiovascular:  Negative for chest pain, palpitations and leg swelling.  Gastrointestinal:  Negative for abdominal pain and blood in stool.  Genitourinary:  Negative for dysuria.  Musculoskeletal:  Negative for myalgias.  Skin:  Negative for rash.  Neurological:  Positive for headaches. Negative for dizziness.  Endo/Heme/Allergies:  Does not bruise/bleed easily.  Psychiatric/Behavioral:  Negative for depression. The patient is not nervous/anxious.      OBJECTIVE Physical Exam: Vitals:   02/01/24 1307  BP: 130/86  Pulse: 91  Weight: 144 lb (65.3 kg)  Height: 5\' 1"  (1.549 m)    Physical Exam Vitals reviewed. Exam conducted with a chaperone present.  Constitutional:      General: She is not in acute distress. Pulmonary:     Effort: Pulmonary effort is normal.  Abdominal:     General: There is no distension.     Palpations: Abdomen is soft.     Tenderness: There is no abdominal tenderness. There is no rebound.  Musculoskeletal:        General: No swelling. Normal range of motion.  Skin:    General: Skin is warm and dry.     Findings: No rash.  Neurological:     Mental Status: She is alert and oriented to person, place, and time.  Psychiatric:        Mood and Affect: Mood normal.        Behavior: Behavior normal.      GU / Detailed Urogynecologic Evaluation:  Pelvic Exam: Normal external female genitalia; Bartholin's and Skene's glands normal in appearance; urethral meatus normal in appearance, no urethral masses or discharge.   CST: negative  Speculum exam reveals normal vaginal mucosa without atrophy. Cervix normal appearance.  Uterus normal single, nontender. Adnexa no mass, fullness, tenderness.    Pelvic floor strength III/V  Pelvic floor musculature: Right levator non-tender, Right obturator non-tender, Left levator non-tender, Left obturator non-tender  POP-Q:   POP-Q  -3                                            Aa   -  3                                           Ba  -7                                              C   2                                            Gh  4                                            Pb  9                                            tvl   -3                                            Ap  -3                                            Bp  -9                                              D      Rectal Exam:  Normal external  Post-Void Residual (PVR) by Bladder Scan: In order to evaluate bladder emptying, we discussed obtaining a postvoid residual and patient agreed to this procedure.  Procedure: The ultrasound unit was placed on the patient's abdomen in the suprapubic region after the patient had voided.    Post Void Residual - 02/01/24 1330       Post Void Residual   Post Void Residual 11 mL              Laboratory Results: Lab Results  Component Value Date   COLORU yellow 02/01/2024   CLARITYU clear 02/01/2024   GLUCOSEUR negative 02/01/2024   BILIRUBINUR negative 02/01/2024   KETONESU negative 10/24/2022   SPECGRAV <=1.005 (A) 02/01/2024   RBCUR negative 02/01/2024   PHUR 6.5 02/01/2024   PROTEINUR NEGATIVE 10/24/2022   UROBILINOGEN 0.2 02/01/2024   LEUKOCYTESUR Negative 02/01/2024    Lab Results  Component Value Date   CREATININE 0.64 03/16/2023   CREATININE 0.64 02/28/2022   CREATININE 0.69 02/04/2021    Lab Results  Component Value Date   HGBA1C 5.4 03/16/2023    Lab Results  Component Value Date   HGB 13.3 03/16/2023     ASSESSMENT AND PLAN Ms. Brester is a 43 y.o. with:  1. Recurrent urinary tract  infection   2.  Abnormal urine odor   3. Urinary frequency     - We discussed that urine odor is not always an indicator of infection. Odor can be impacted by hydration status, diet or urinary microbiome. POC Urine is normal today. UTI is otherwise characterized by dysuria or suprapubic pain along with increased urgency and frequency.  - We discussed that she may have a colonization of bacteria present if her cultures are coming back positive despite having no true UTI symptoms.  - She does fit criteria for recurrent UTI as she had a culture proven infection in Nov and February. She reports at those times it was only the odor and she did not have other symptoms. We discussed prevention with methenamine 1g BID but she would prefer to hold off on any medications since she is asymptomatic.  - Can continue to take d-mannose and probiotic for prevention.  - If she does have UTI symptoms, can call the office to provide a urine sample for testing.  - Aptima swab sent today to r/o vaginal infection as cause for odor.  Return as needed  Arma Lamp, MD

## 2024-02-02 LAB — CERVICOVAGINAL ANCILLARY ONLY
Bacterial Vaginitis (gardnerella): NEGATIVE
Candida Glabrata: NEGATIVE
Candida Vaginitis: NEGATIVE
Comment: NEGATIVE
Comment: NEGATIVE
Comment: NEGATIVE

## 2024-02-03 ENCOUNTER — Other Ambulatory Visit: Payer: Self-pay

## 2024-02-03 ENCOUNTER — Telehealth: Payer: Self-pay

## 2024-02-03 NOTE — Telephone Encounter (Signed)
 Copied from CRM [id number]. Topic: Appointments - Transfer of Care >> Feb 03, 2024  2:33 PM Aisha D wrote: Pt is requesting to transfer FROM: [facility] [place], Dr.Shannon Banks Pt is requesting to transfer TO: [facility] [place], Dr.Samantha Yeager Reason for requested transfer: Looking for new PCP It is the responsibility of the team the patient would like to transfer to (Dr. Roark Chick) to reach out to the patient if for any reason this transfer is not acceptable.

## 2024-02-09 ENCOUNTER — Other Ambulatory Visit: Payer: Self-pay | Admitting: Neurology

## 2024-02-09 ENCOUNTER — Encounter: Payer: Self-pay | Admitting: Neurology

## 2024-02-09 DIAGNOSIS — G43709 Chronic migraine without aura, not intractable, without status migrainosus: Secondary | ICD-10-CM

## 2024-02-10 ENCOUNTER — Telehealth: Payer: Self-pay | Admitting: Neurology

## 2024-02-10 NOTE — Telephone Encounter (Signed)
 Referral for neurology fax to [facility] to with Ascencion Black, MD. Phone: [phone], Fax: [phone]

## 2024-03-17 ENCOUNTER — Encounter: Payer: Self-pay | Admitting: Neurology

## 2024-03-17 NOTE — Telephone Encounter (Signed)
 Referral for Orthoepedics faxed to [facility])  Maralee Verlie) Phone:[phone] Fax:[phone]

## 2024-03-17 NOTE — Telephone Encounter (Signed)
 Awesome, thanks.

## 2024-03-17 NOTE — Telephone Encounter (Signed)
 Called [facility] spoke with Lonell Quinton has received Pt Referral  and will give Pt a call  to schedule appt .  Called Pt and informed PT that [facility] will be called to schedule appt .

## 2024-03-17 NOTE — Telephone Encounter (Signed)
Error wrong PT

## 2024-04-04 ENCOUNTER — Other Ambulatory Visit (HOSPITAL_BASED_OUTPATIENT_CLINIC_OR_DEPARTMENT_OTHER): Payer: Self-pay

## 2024-04-04 MED ORDER — ESTRADIOL 0.025 MG/24HR TD PTWK
MEDICATED_PATCH | TRANSDERMAL | 11 refills | Status: AC
  Filled 2024-04-04: qty 4, 56d supply, fill #0

## 2024-04-07 ENCOUNTER — Encounter: Payer: Self-pay | Admitting: Family Medicine

## 2024-04-07 ENCOUNTER — Ambulatory Visit (INDEPENDENT_AMBULATORY_CARE_PROVIDER_SITE_OTHER): Admitting: Family Medicine

## 2024-04-07 VITALS — BP 132/76 | HR 73 | Temp 98.5°F | Ht 61.0 in | Wt 149.4 lb

## 2024-04-07 DIAGNOSIS — Z0000 Encounter for general adult medical examination without abnormal findings: Secondary | ICD-10-CM | POA: Diagnosis not present

## 2024-04-07 DIAGNOSIS — R202 Paresthesia of skin: Secondary | ICD-10-CM | POA: Diagnosis not present

## 2024-04-07 LAB — CBC WITH DIFFERENTIAL/PLATELET
Basophils Absolute: 0.1 K/uL (ref 0.0–0.1)
Basophils Relative: 1.1 % (ref 0.0–3.0)
Eosinophils Absolute: 0.2 K/uL (ref 0.0–0.7)
Eosinophils Relative: 3.4 % (ref 0.0–5.0)
HCT: 37 % (ref 36.0–46.0)
Hemoglobin: 12.5 g/dL (ref 12.0–15.0)
Lymphocytes Relative: 22.4 % (ref 12.0–46.0)
Lymphs Abs: 1.4 K/uL (ref 0.7–4.0)
MCHC: 33.9 g/dL (ref 30.0–36.0)
MCV: 87.4 fl (ref 78.0–100.0)
Monocytes Absolute: 0.5 K/uL (ref 0.1–1.0)
Monocytes Relative: 7.7 % (ref 3.0–12.0)
Neutro Abs: 4 K/uL (ref 1.4–7.7)
Neutrophils Relative %: 65.4 % (ref 43.0–77.0)
Platelets: 267 K/uL (ref 150.0–400.0)
RBC: 4.23 Mil/uL (ref 3.87–5.11)
RDW: 12.5 % (ref 11.5–15.5)
WBC: 6.2 K/uL (ref 4.0–10.5)

## 2024-04-07 LAB — COMPREHENSIVE METABOLIC PANEL WITH GFR
ALT: 9 U/L (ref 0–35)
AST: 14 U/L (ref 0–37)
Albumin: 4.3 g/dL (ref 3.5–5.2)
Alkaline Phosphatase: 53 U/L (ref 39–117)
BUN: 9 mg/dL (ref 6–23)
CO2: 24 meq/L (ref 19–32)
Calcium: 8.7 mg/dL (ref 8.4–10.5)
Chloride: 104 meq/L (ref 96–112)
Creatinine, Ser: 0.66 mg/dL (ref 0.40–1.20)
GFR: 106.68 mL/min (ref >60.00)
Glucose, Bld: 88 mg/dL (ref 70–99)
Potassium: 3.9 meq/L (ref 3.5–5.1)
Sodium: 136 meq/L (ref 135–145)
Total Bilirubin: 0.7 mg/dL (ref 0.2–1.2)
Total Protein: 7 g/dL (ref 6.0–8.3)

## 2024-04-07 LAB — LIPID PANEL
Cholesterol: 162 mg/dL (ref 0–200)
HDL: 70.8 mg/dL (ref >39.00)
LDL Cholesterol: 79 mg/dL (ref 0–99)
NonHDL: 91
Total CHOL/HDL Ratio: 2
Triglycerides: 62 mg/dL (ref 0.0–149.0)
VLDL: 12.4 mg/dL (ref 0.0–40.0)

## 2024-04-07 LAB — VITAMIN B12: Vitamin B-12: 347 pg/mL (ref 211–911)

## 2024-04-07 LAB — T4, FREE: Free T4: 0.81 ng/dL (ref 0.60–1.60)

## 2024-04-07 LAB — FOLATE: Folate: 15.2 ng/mL (ref >5.9)

## 2024-04-07 LAB — HEMOGLOBIN A1C: Hgb A1c MFr Bld: 5.6 % (ref 4.6–6.5)

## 2024-04-07 LAB — TSH: TSH: 1.25 u[IU]/mL (ref 0.35–5.50)

## 2024-04-07 NOTE — Progress Notes (Signed)
 Established Patient Office Visit   Subjective  Patient ID: [name], female    DOB: 1980/11/10  Age: 43 y.o. MRN: [id number]  Chief Complaint  Patient presents with   Annual Exam    Pt is a 43 year old female seen for CPE.  Patient states she is doing well overall.  Seen by OB/GYN.  Also followed by neurology for history of migraines.  Endorses less migraines.  Having some tension in neck and shoulders.  Patient has noticed tingling in left 3rd, 4th and 5th digits.  Patient is right-handed.  Patient does some typing.  Denies pain, weakness in hand or LUE.  At times sleeps with wrist bent and has to straighten her hand out.    Patient Active Problem List   Diagnosis Date Noted   Migraine without aura and without status migrainosus, not intractable 09/20/2023   Chronic migraine without aura without status migrainosus, not intractable 12/24/2017   Hx gestational diabetes 07/02/2015   Systolic murmur - had eval with cardiology in 2014 and all ok per pt report 08/04/2013   Palpitations 08/04/2013   Past Medical History:  Diagnosis Date   Anal fissure    Anxiety    on zyprexa in the past - stopped medication in 2015   Blood in stool    Chicken pox    Hypoglycemia    has seen endocrinologist   Migraines    PVC (premature ventricular contraction)    Urinary tract infection    Past Surgical History:  Procedure Laterality Date   CESAREAN SECTION     x 2, 7990,7987   WISDOM TOOTH EXTRACTION     Social History   Tobacco Use   Smoking status: Never   Smokeless tobacco: Never  Vaping Use   Vaping status: Never Used  Substance Use Topics   Alcohol use: Yes    Alcohol/week: 6.0 standard drinks of alcohol    Types: 6 Glasses of wine per week   Drug use: No   Family History  Problem Relation Age of Onset   Healthy Mother    Healthy Father    Infertility Sister    Healthy Brother    Breast cancer Maternal Grandmother 11       (died in her late 78's after mets)    Esophageal cancer Maternal Grandmother    Stomach cancer Maternal Grandmother    Cancer Maternal Grandmother    Diabetes Paternal Grandmother    ALS Paternal Grandmother    Heart disease Paternal Grandfather    Prostate cancer Paternal Grandfather    Breast cancer Paternal Aunt    Cancer Paternal Aunt    Ovarian cancer Paternal Aunt    Colon cancer Other        maternal great aunt   Migraines Neg Hx    Headache Neg Hx    Bladder Cancer Neg Hx    Uterine cancer Neg Hx    Renal cancer Neg Hx    No Known Allergies  ROS Negative unless stated above    Objective:     BP 132/76 (BP Location: Left Arm, Patient Position: Sitting)   Pulse 73   Temp 98.5 F (36.9 C) (Oral)   Ht 5' 1 (1.549 m)   Wt 149 lb 6.4 oz (67.8 kg)   LMP 04/06/2024 (Exact Date)   SpO2 100%   BMI 28.23 kg/m  BP Readings from Last 3 Encounters:  04/07/24 132/76  02/01/24 130/86  03/16/23 138/86   Wt Readings from Last 3  Encounters:  04/07/24 149 lb 6.4 oz (67.8 kg)  02/01/24 144 lb (65.3 kg)  03/16/23 138 lb 9.6 oz (62.9 kg)      Physical Exam Constitutional:      Appearance: Normal appearance.  HENT:     Head: Normocephalic and atraumatic.     Right Ear: Tympanic membrane, ear canal and external ear normal.     Left Ear: Tympanic membrane, ear canal and external ear normal.     Nose: Nose normal.     Mouth/Throat:     Mouth: Mucous membranes are moist.     Pharynx: No oropharyngeal exudate or posterior oropharyngeal erythema.  Eyes:     General: No scleral icterus.    Extraocular Movements: Extraocular movements intact.     Conjunctiva/sclera: Conjunctivae normal.     Pupils: Pupils are equal, round, and reactive to light.  Neck:     Thyroid: No thyromegaly.  Cardiovascular:     Rate and Rhythm: Normal rate and regular rhythm.     Pulses: Normal pulses.     Heart sounds: Normal heart sounds. No murmur heard.    No friction rub.  Pulmonary:     Effort: Pulmonary effort is normal.      Breath sounds: Normal breath sounds. No wheezing, rhonchi or rales.  Abdominal:     General: Bowel sounds are normal.     Palpations: Abdomen is soft.     Tenderness: There is no abdominal tenderness.  Musculoskeletal:        General: No deformity. Normal range of motion.  Lymphadenopathy:     Cervical: No cervical adenopathy.  Skin:    General: Skin is warm and dry.     Findings: No lesion.  Neurological:     General: No focal deficit present.     Mental Status: She is alert and oriented to person, place, and time.     Comments: Negative Tinel and Phalen's.  Negative axial loading.  Grip strength 5/5 on left and 4/5 on right.  Psychiatric:        Mood and Affect: Mood normal.        Thought Content: Thought content normal.        04/07/2024    8:41 AM 03/16/2023    8:36 AM 10/24/2022    7:11 AM  Depression screen PHQ 2/9  Decreased Interest 0 0 0  Down, Depressed, Hopeless 0 0 0  PHQ - 2 Score 0 0 0  Altered sleeping 1 0   Tired, decreased energy 0 0   Change in appetite 0 0   Feeling bad or failure about yourself  0 0   Trouble concentrating 0 0   Moving slowly or fidgety/restless 0 0   Suicidal thoughts 0 0   PHQ-9 Score 1 0   Difficult doing work/chores Not difficult at all        04/07/2024    8:41 AM 03/16/2023    8:36 AM  GAD 7 : Generalized Anxiety Score  Nervous, Anxious, on Edge 0 0  Control/stop worrying 0 0  Worry too much - different things 0 1  Trouble relaxing 0 1  Restless 0 0  Easily annoyed or irritable 0 0  Afraid - awful might happen 0 0  Total GAD 7 Score 0 2  Anxiety Difficulty Not difficult at all Not difficult at all     No results found for any visits on 04/07/24.    Assessment & Plan:   Well adult exam -  CBC with Differential/Platelet; Future -     Comprehensive metabolic panel with GFR; Future -     Hemoglobin A1c; Future -     Lipid panel; Future -     T4, free; Future -     TSH; Future  Paresthesias -     CBC with  Differential/Platelet; Future -     Comprehensive metabolic panel with GFR; Future -     Vitamin B12; Future -     Folate; Future -     Iron, TIBC and Ferritin Panel; Future  Age-appropriate health screenings discussed.  Obtain labs.  Immunizations reviewed.  Obtain flu vaccine when available in the next few weeks.  Pap  done 02/12/23 with OB/Gyn.  Discussed possible causes of paresthesias including cervical nerve compression, carpal tunnel, Guyon canal syndrome.  Consider wrist splints.  Return in about 1 year (around 04/07/2025) for physical.  f/u in a few months for paresthesia.   Clotilda JONELLE Single, MD

## 2024-04-08 ENCOUNTER — Ambulatory Visit: Payer: Self-pay | Admitting: Family Medicine

## 2024-04-08 LAB — IRON,TIBC AND FERRITIN PANEL
%SAT: 32 % (ref 16–45)
Ferritin: 19 ng/mL (ref 16–232)
Iron: 108 ug/dL (ref 40–190)
TIBC: 335 ug/dL (ref 250–450)

## 2024-04-14 ENCOUNTER — Other Ambulatory Visit (HOSPITAL_BASED_OUTPATIENT_CLINIC_OR_DEPARTMENT_OTHER): Payer: Self-pay

## 2024-06-01 ENCOUNTER — Ambulatory Visit (INDEPENDENT_AMBULATORY_CARE_PROVIDER_SITE_OTHER)

## 2024-06-01 DIAGNOSIS — Z111 Encounter for screening for respiratory tuberculosis: Secondary | ICD-10-CM

## 2024-06-01 NOTE — Progress Notes (Signed)
 Patient is in office today for a nurse visit for Tb Skin Test . Patient Injection was given in the  Left arm. Patient tolerated injection well. Placed on left foream. Patient aware to come back for skini test reading Friday 06/03/2024 at 3:15pm.

## 2024-06-03 ENCOUNTER — Ambulatory Visit

## 2024-06-03 DIAGNOSIS — Z111 Encounter for screening for respiratory tuberculosis: Secondary | ICD-10-CM

## 2024-06-03 NOTE — Progress Notes (Signed)
 Patient is in office today for a nurse visit for PPD Skin Test Reading. Patient has a negative skin test reading.

## 2024-06-10 ENCOUNTER — Other Ambulatory Visit (HOSPITAL_BASED_OUTPATIENT_CLINIC_OR_DEPARTMENT_OTHER): Payer: Self-pay

## 2024-06-10 MED ORDER — FLUZONE 0.5 ML IM SUSY
0.5000 mL | PREFILLED_SYRINGE | Freq: Once | INTRAMUSCULAR | 0 refills | Status: AC
  Filled 2024-06-10: qty 0.5, 1d supply, fill #0

## 2024-06-14 ENCOUNTER — Ambulatory Visit: Admitting: Neurology

## 2024-08-17 ENCOUNTER — Encounter: Admitting: Physician Assistant

## 2024-09-05 ENCOUNTER — Encounter: Payer: Self-pay | Admitting: *Deleted

## 2024-09-09 ENCOUNTER — Telehealth: Payer: Self-pay | Admitting: Pharmacist

## 2024-09-09 NOTE — Telephone Encounter (Signed)
 Pharmacy Patient Advocate Encounter   Received notification from CoverMyMeds that prior authorization for Emgality  120MG /ML auto-injectors (migraine) is due for renewal.   Insurance verification completed.   The patient is insured through [facility].  Action: Medication has been discontinued. Archived Key: [id number] Per chart notes, patient has discontinued medication due to lack of efficacy.

## 2024-09-30 ENCOUNTER — Ambulatory Visit: Admitting: Family Medicine

## 2024-09-30 ENCOUNTER — Ambulatory Visit: Payer: Self-pay | Admitting: *Deleted

## 2024-09-30 ENCOUNTER — Other Ambulatory Visit (HOSPITAL_BASED_OUTPATIENT_CLINIC_OR_DEPARTMENT_OTHER): Payer: Self-pay

## 2024-09-30 ENCOUNTER — Encounter: Payer: Self-pay | Admitting: Family Medicine

## 2024-09-30 MED ORDER — METHOCARBAMOL 500 MG PO TABS
500.0000 mg | ORAL_TABLET | Freq: Three times a day (TID) | ORAL | 0 refills | Status: AC | PRN
  Filled 2024-09-30: qty 30, 10d supply, fill #0

## 2024-09-30 MED ORDER — NAPROXEN 500 MG PO TABS
500.0000 mg | ORAL_TABLET | Freq: Two times a day (BID) | ORAL | 0 refills | Status: AC
  Filled 2024-09-30: qty 30, 15d supply, fill #0

## 2024-09-30 NOTE — Telephone Encounter (Signed)
" °  FYI Only or Action Required?: FYI only for provider: appointment scheduled on 2/6.  Patient was last seen in primary care on 04/07/2024 by Mercer Clotilda SAUNDERS, MD.  Called Nurse Triage reporting Neck Pain.  Symptoms began several days ago.  Interventions attempted: OTC medications: naproxen   and Ice/heat application.  Symptoms are: gradually worsening.  Triage Disposition: See HCP Within 4 Hours (Or PCP Triage)  Patient/caregiver understands and will follow disposition?: Yes  Message from [place] S sent at 09/30/2024 12:41 PM EST  Reason for Triage: Patient thinks she injured her neck. She may have slept wrong and was lifting weights, now causing pain   Reason for Disposition  [1] SEVERE neck pain (e.g., excruciating, unable to do any normal activities) AND [2] not improved after 2 hours of pain medicine  Answer Assessment - Initial Assessment Questions Patient reports she slept wrong and woke with neck pain- mainly on left side- she did lift weights the next day which may have caused increased pain, Patient states she does have some pain in her Left upper arm that comes and goes. Heat and naproxen  not really helping a lot. Denies chest pain, breathing difficulty.   1. ONSET: When did the pain begin?      2 days ago 2. LOCATION: Where does it hurt?      Bottom and to the left 3. PATTERN Does the pain come and go, or has it been constant since it started?      Constant- sometimes worse than others 4. SEVERITY: How bad is the pain?  (Scale 0-10; or none or slight stiffness, mild, moderate, severe)     7-8/10 5. RADIATION: Does the pain go anywhere else, shoot into your arms?     Shooting pain into arm at times 6. CORD SYMPTOMS: Any weakness or numbness of the arms or legs?     no 7. CAUSE: What do you think is causing the neck pain?     Muscle strain- may have slept on it wrong- did lift weights after 8. NECK OVERUSE: Any recent activities that involved turning or  twisting the neck?     no 9. OTHER SYMPTOMS: Do you have any other symptoms? (e.g., headache, fever, chest pain, difficulty breathing, neck swelling)     no  Protocols used: Neck Pain or Stiffness-A-AH  "

## 2024-09-30 NOTE — Telephone Encounter (Signed)
 Patient has appt 2/6

## 2024-09-30 NOTE — Patient Instructions (Signed)
 Follow up as needed or as scheduled START the Naproxen  twice daily- take w/ food- for 7-10 days USE the Methocarbamol  as needed for spasm CONTINUE the heating pad as needed Gentle stretching to avoid worsening stiffness Call with any questions or concerns Hang in there!

## 2024-09-30 NOTE — Progress Notes (Unsigned)
" ° °  Subjective:    Patient ID: [name], female    DOB: 11-07-1980, 44 y.o.   MRN: [id number]  HPI Neck pain- L sided, feels like she may have slept wrong.  Continued her activities and was lifting weights when she later developed pain that radiates from back of neck into shoulder and down arm.  R hand dominant.  Denies weakness of grip or arm.  Pain described as a shooting pain.  Some relief w/ Naproxen , minimal change w/ Tylenol .  Questionable improvement w/ heating pad.     Review of Systems For ROS see HPI     Objective:   Physical Exam        Assessment & Plan:    "

## 2024-10-14 ENCOUNTER — Encounter

## 2024-11-04 ENCOUNTER — Encounter: Admitting: Gastroenterology
